# Patient Record
Sex: Female | Born: 1990 | Race: White | Hispanic: Yes | Marital: Married | Smoking: Current every day smoker
Health system: Southern US, Community
[De-identification: ages and names within clinical notes are randomized; demographics above are authoritative.]

## PROBLEM LIST (undated history)

## (undated) DIAGNOSIS — Z8619 Personal history of other infectious and parasitic diseases: Secondary | ICD-10-CM

## (undated) DIAGNOSIS — F329 Major depressive disorder, single episode, unspecified: Secondary | ICD-10-CM

## (undated) DIAGNOSIS — F32A Depression, unspecified: Secondary | ICD-10-CM

## (undated) HISTORY — DX: Major depressive disorder, single episode, unspecified: F32.9

## (undated) HISTORY — PX: HERNIA REPAIR: SHX51

## (undated) HISTORY — DX: Personal history of other infectious and parasitic diseases: Z86.19

## (undated) HISTORY — DX: Depression, unspecified: F32.A

---

## 2013-03-08 LAB — OB RESULTS CONSOLE ANTIBODY SCREEN: Antibody Screen: NEGATIVE

## 2013-03-08 LAB — OB RESULTS CONSOLE GC/CHLAMYDIA
Chlamydia: NEGATIVE
Gonorrhea: NEGATIVE

## 2013-03-08 LAB — OB RESULTS CONSOLE VARICELLA ZOSTER ANTIBODY, IGG: VARICELLA IGG: IMMUNE

## 2013-03-08 LAB — OB RESULTS CONSOLE HIV ANTIBODY (ROUTINE TESTING): HIV: NONREACTIVE

## 2013-03-08 LAB — OB RESULTS CONSOLE RUBELLA ANTIBODY, IGM: Rubella: IMMUNE

## 2013-03-08 LAB — OB RESULTS CONSOLE ABO/RH: RH Type: POSITIVE

## 2013-03-08 LAB — OB RESULTS CONSOLE RPR: RPR: NONREACTIVE

## 2013-03-08 LAB — OB RESULTS CONSOLE HEPATITIS B SURFACE ANTIGEN: HEP B S AG: NEGATIVE

## 2013-04-05 LAB — US OB COMP LESS 14 WKS

## 2013-04-26 LAB — OB RESULTS CONSOLE TSH: TSH: 2.65

## 2013-06-19 LAB — US OB COMP + 14 WK

## 2013-08-30 ENCOUNTER — Encounter: Payer: Self-pay | Admitting: *Deleted

## 2013-09-04 ENCOUNTER — Encounter: Payer: Self-pay | Admitting: Women's Health

## 2013-09-10 ENCOUNTER — Other Ambulatory Visit (HOSPITAL_COMMUNITY)
Admission: RE | Admit: 2013-09-10 | Discharge: 2013-09-10 | Disposition: A | Discharge status: Home / Self Care | Payer: Medicaid Other | Source: Ambulatory Visit | Attending: Obstetrics & Gynecology | Admitting: Obstetrics & Gynecology

## 2013-09-10 ENCOUNTER — Ambulatory Visit (INDEPENDENT_AMBULATORY_CARE_PROVIDER_SITE_OTHER): Payer: Medicaid Other | Admitting: Women's Health

## 2013-09-10 ENCOUNTER — Encounter: Payer: Self-pay | Admitting: Women's Health

## 2013-09-10 VITALS — BP 118/58 | Ht 63.0 in | Wt 192.5 lb

## 2013-09-10 DIAGNOSIS — F17200 Nicotine dependence, unspecified, uncomplicated: Secondary | ICD-10-CM

## 2013-09-10 DIAGNOSIS — O418X10 Other specified disorders of amniotic fluid and membranes, first trimester, not applicable or unspecified: Secondary | ICD-10-CM

## 2013-09-10 DIAGNOSIS — O34219 Maternal care for unspecified type scar from previous cesarean delivery: Secondary | ICD-10-CM

## 2013-09-10 DIAGNOSIS — A5901 Trichomonal vulvovaginitis: Secondary | ICD-10-CM

## 2013-09-10 DIAGNOSIS — Z3480 Encounter for supervision of other normal pregnancy, unspecified trimester: Secondary | ICD-10-CM

## 2013-09-10 DIAGNOSIS — Z01419 Encounter for gynecological examination (general) (routine) without abnormal findings: Secondary | ICD-10-CM | POA: Insufficient documentation

## 2013-09-10 DIAGNOSIS — Z331 Pregnant state, incidental: Secondary | ICD-10-CM

## 2013-09-10 DIAGNOSIS — O23591 Infection of other part of genital tract in pregnancy, first trimester: Secondary | ICD-10-CM | POA: Insufficient documentation

## 2013-09-10 DIAGNOSIS — O2390 Unspecified genitourinary tract infection in pregnancy, unspecified trimester: Secondary | ICD-10-CM

## 2013-09-10 DIAGNOSIS — F1290 Cannabis use, unspecified, uncomplicated: Secondary | ICD-10-CM

## 2013-09-10 DIAGNOSIS — Z98891 History of uterine scar from previous surgery: Secondary | ICD-10-CM

## 2013-09-10 DIAGNOSIS — O98519 Other viral diseases complicating pregnancy, unspecified trimester: Secondary | ICD-10-CM

## 2013-09-10 DIAGNOSIS — Z1389 Encounter for screening for other disorder: Secondary | ICD-10-CM

## 2013-09-10 LAB — POCT URINALYSIS DIPSTICK
Blood, UA: NEGATIVE
Glucose, UA: NEGATIVE
Ketones, UA: NEGATIVE
Leukocytes, UA: NEGATIVE
NITRITE UA: NEGATIVE
Protein, UA: NEGATIVE

## 2013-09-10 NOTE — Patient Instructions (Addendum)
FMLA (Family Medical Leave Act)  Third Trimester of Pregnancy The third trimester is from week 29 through week 42, months 7 through 9. The third trimester is a time when the fetus is growing rapidly. At the end of the ninth month, the fetus is about 20 inches in length and weighs 6 10 pounds.  BODY CHANGES Your body goes through many changes during pregnancy. The changes vary from woman to woman.   Your weight will continue to increase. You can expect to gain 25 35 pounds (11 16 kg) by the end of the pregnancy.  You may begin to get stretch marks on your hips, abdomen, and breasts.  You may urinate more often because the fetus is moving lower into your pelvis and pressing on your bladder.  You may develop or continue to have heartburn as a result of your pregnancy.  You may develop constipation because certain hormones are causing the muscles that push waste through your intestines to slow down.  You may develop hemorrhoids or swollen, bulging veins (varicose veins).  You may have pelvic pain because of the weight gain and pregnancy hormones relaxing your joints between the bones in your pelvis. Back aches may result from over exertion of the muscles supporting your posture.  Your breasts will continue to grow and be tender. A yellow discharge may leak from your breasts called colostrum.  Your belly button may stick out.  You may feel short of breath because of your expanding uterus.  You may notice the fetus "dropping," or moving lower in your abdomen.  You may have a bloody mucus discharge. This usually occurs a few days to a week before labor begins.  Your cervix becomes thin and soft (effaced) near your due date. WHAT TO EXPECT AT YOUR PRENATAL EXAMS  You will have prenatal exams every 2 weeks until week 36. Then, you will have weekly prenatal exams. During a routine prenatal visit:  You will be weighed to make sure you and the fetus are growing normally.  Your blood pressure  is taken.  Your abdomen will be measured to track your baby's growth.  The fetal heartbeat will be listened to.  Any test results from the previous visit will be discussed.  You may have a cervical check near your due date to see if you have effaced. At around 36 weeks, your caregiver will check your cervix. At the same time, your caregiver will also perform a test on the secretions of the vaginal tissue. This test is to determine if a type of bacteria, Group B streptococcus, is present. Your caregiver will explain this further. Your caregiver may ask you:  What your birth plan is.  How you are feeling.  If you are feeling the baby move.  If you have had any abnormal symptoms, such as leaking fluid, bleeding, severe headaches, or abdominal cramping.  If you have any questions. Other tests or screenings that may be performed during your third trimester include:  Blood tests that check for low iron levels (anemia).  Fetal testing to check the health, activity level, and growth of the fetus. Testing is done if you have certain medical conditions or if there are problems during the pregnancy. FALSE LABOR You may feel small, irregular contractions that eventually go away. These are called Braxton Hicks contractions, or false labor. Contractions may last for hours, days, or even weeks before true labor sets in. If contractions come at regular intervals, intensify, or become painful, it is best to  be seen by your caregiver.  SIGNS OF LABOR   Menstrual-like cramps.  Contractions that are 5 minutes apart or less.  Contractions that start on the top of the uterus and spread down to the lower abdomen and back.  A sense of increased pelvic pressure or back pain.  A watery or bloody mucus discharge that comes from the vagina. If you have any of these signs before the 37th week of pregnancy, call your caregiver right away. You need to go to the hospital to get checked immediately. HOME CARE  INSTRUCTIONS   Avoid all smoking, herbs, alcohol, and unprescribed drugs. These chemicals affect the formation and growth of the baby.  Follow your caregiver's instructions regarding medicine use. There are medicines that are either safe or unsafe to take during pregnancy.  Exercise only as directed by your caregiver. Experiencing uterine cramps is a good sign to stop exercising.  Continue to eat regular, healthy meals.  Wear a good support bra for breast tenderness.  Do not use hot tubs, steam rooms, or saunas.  Wear your seat belt at all times when driving.  Avoid raw meat, uncooked cheese, cat litter boxes, and soil used by cats. These carry germs that can cause birth defects in the baby.  Take your prenatal vitamins.  Try taking a stool softener (if your caregiver approves) if you develop constipation. Eat more high-fiber foods, such as fresh vegetables or fruit and whole grains. Drink plenty of fluids to keep your urine clear or pale yellow.  Take warm sitz baths to soothe any pain or discomfort caused by hemorrhoids. Use hemorrhoid cream if your caregiver approves.  If you develop varicose veins, wear support hose. Elevate your feet for 15 minutes, 3 4 times a day. Limit salt in your diet.  Avoid heavy lifting, wear low heal shoes, and practice good posture.  Rest a lot with your legs elevated if you have leg cramps or low back pain.  Visit your dentist if you have not gone during your pregnancy. Use a soft toothbrush to brush your teeth and be gentle when you floss.  A sexual relationship may be continued unless your caregiver directs you otherwise.  Do not travel far distances unless it is absolutely necessary and only with the approval of your caregiver.  Take prenatal classes to understand, practice, and ask questions about the labor and delivery.  Make a trial run to the hospital.  Pack your hospital bag.  Prepare the baby's nursery.  Continue to go to all your  prenatal visits as directed by your caregiver. SEEK MEDICAL CARE IF:  You are unsure if you are in labor or if your water has broken.  You have dizziness.  You have mild pelvic cramps, pelvic pressure, or nagging pain in your abdominal area.  You have persistent nausea, vomiting, or diarrhea.  You have a bad smelling vaginal discharge.  You have pain with urination. SEEK IMMEDIATE MEDICAL CARE IF:   You have a fever.  You are leaking fluid from your vagina.  You have spotting or bleeding from your vagina.  You have severe abdominal cramping or pain.  You have rapid weight loss or gain.  You have shortness of breath with chest pain.  You notice sudden or extreme swelling of your face, hands, ankles, feet, or legs.  You have not felt your baby move in over an hour.  You have severe headaches that do not go away with medicine.  You have vision changes. Document Released:  07/06/2001 Document Revised: 03/14/2013 Document Reviewed: 09/12/2012 [facility] Patient Information 2014 [place].

## 2013-09-10 NOTE — Progress Notes (Signed)
Subjective:  Cathy Larson is a 23 y.o. G53P2002 Caucasian female at [redacted]w[redacted]d by LMP which differs from 10wk u/s by 6d, being seen today for her first obstetrical visit w/ [place]. She is a transfer from [facility] in [place], states she didn't feel comfortable w/ that practice.  Her obstetrical history is significant for smoker and prev c/s x 2- 1st for FTP @ 5cm, 2nd was RLTCS.  Interested in VBAC. Pregnancy history fully reviewed.  She desires CF screening.  Patient reports darkened area bilateral upper inner thighs. Denies vb, cramping, uti s/s, abnormal/malodorous vag d/c, or vulvovaginal itching/irritation.    She had an initially low TSH, repeat was normal. She also had +trichomonas and was treated. No poc noted in records. States she hasn't had a pap smear since last preg 47yrs ago. Her early uds was +for THC. NT u/s report states possible amniotic band- no limbs involved. No mention of this on anatomy u/s.    BP 118/58  Ht 5\' 3"  (1.6 m)  Wt 192 lb 8 oz (87.317 kg)  BMI 34.11 kg/m2  LMP 01/25/2013 47lb weight gain to date during pregnancy  HISTORY: OB History  Gravida Para Term Preterm AB SAB TAB Ectopic Multiple Living  3 2 2       2     # Outcome Date GA Lbr Len/2nd Weight Sex Delivery Anes PTL Lv  3 CUR           2 TRM 06/05/09 [redacted]w[redacted]d  7 lb 8 oz (3.402 kg) F LTCS Spinal  Y  1 TRM 06/21/07 [redacted]w[redacted]d  8 lb 9 oz (3.884 kg) M LTCS EPI  Y     Comments: FTP @ 5cm     Past Medical History  Diagnosis Date  . Depression   . History of trichomoniasis    Past Surgical History  Procedure Laterality Date  . Cesarean section    . Hernia repair      pt was 60 weeks old   Family History  Problem Relation Age of Onset  . Thyroid disease Mother   . Cancer Maternal Grandmother     brain  . Cancer Other     lung  . Cancer Other     brain    Exam   System:     General: Well developed & nourished, no acute distress   Skin: Warm & dry, normal coloration, turgor, no rashes   Neurologic: Alert & oriented, normal mood   Cardiovascular: Regular rate & rhythm   Respiratory: Effort & rate normal, LCTAB, acyanotic   Abdomen: Soft, non tender   Extremities: normal strength, tone   Pelvic Exam:    Perineum: Normal perineum   Vulva: Normal, no lesions- she does have a darkened discoloration to bilateral upper inner thighs towards vulva   Vagina:  Normal mucosa, normal discharge   Cervix: Normal, bulbous, appears closed   Uterus: Normal size/shape/contour for GA   Thin prep pap smear obtained  FHR: 148 via doppler   Assessment:   Pregnancy: [id number] Patient Active Problem List   Diagnosis Date Noted  . Supervision of other normal pregnancy 09/10/2013    Priority: High  . Previous cesarean section x 2 09/10/2013    Priority: High  . Marijuana use 09/10/2013    Priority: High  . Smoker 09/10/2013    Priority: High  . Trichomonal vaginitis in pregnancy in first trimester 09/10/2013    Priority: High  . 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 09/10/2013  Priority: High    [id number] G3P2002 New OB visit Smoker Prev c/s x 2 Early uds +THC Trich 1st trimester- treated 47lb weight gain to date during pregnancy  Plan:  Labs: CF, HSV2, UDS, thin prep pap today Continue prenatal vitamins Problem list reviewed and updated Reviewed warning s/s to report Discussed r/b of VBAC after 2 c/s, wouldn't be able to induce/augment- she decided for RLTCS Reviewed recommended weight gain based on pre-gravid BMI, discussed current weight gain Encouraged well-balanced diet Genetic Screening discussed Integrated Screen: results reviewed Cystic fibrosis screening discussed requested Ultrasound discussed; fetal survey: results reviewed Follow up in 2 weeks for visit  Marge DuncansBooker, Antuane Eastridge Randall CNM, [facility]NP[facility] 09/10/2013 4:08 PM

## 2013-09-11 LAB — DRUG SCREEN, URINE, NO CONFIRMATION
Amphetamine Screen, Ur: NEGATIVE
BENZODIAZEPINES.: NEGATIVE
Barbiturate Quant, Ur: NEGATIVE
Cocaine Metabolites: NEGATIVE
Creatinine,U: 70.2 mg/dL
Marijuana Metabolite: NEGATIVE
Methadone: NEGATIVE
Opiate Screen, Urine: NEGATIVE
PROPOXYPHENE: NEGATIVE
Phencyclidine (PCP): NEGATIVE

## 2013-09-11 LAB — HSV 2 ANTIBODY, IGG: HSV 2 GLYCOPROTEIN G AB, IGG: 3.26 IV — AB

## 2013-09-11 LAB — CYSTIC FIBROSIS DIAGNOSTIC STUDY

## 2013-09-12 ENCOUNTER — Encounter: Payer: Self-pay | Admitting: Women's Health

## 2013-09-12 DIAGNOSIS — R768 Other specified abnormal immunological findings in serum: Secondary | ICD-10-CM | POA: Insufficient documentation

## 2013-09-17 ENCOUNTER — Encounter: Payer: Self-pay | Admitting: Women's Health

## 2013-09-17 ENCOUNTER — Telehealth: Payer: Self-pay | Admitting: Women's Health

## 2013-09-17 DIAGNOSIS — R87619 Unspecified abnormal cytological findings in specimens from cervix uteri: Secondary | ICD-10-CM | POA: Insufficient documentation

## 2013-09-17 NOTE — Telephone Encounter (Signed)
Notified pt of abnormal pap and need for colpo at next visit. States she is currently at hospital w/ vb and uc's, just got u/s and is waiting to hear results. To keep appt on 3/2 for visit and colpo as long as she is not in hospital.   Cheral MarkerKimberly R. Booker, CNM, [facility]NP-BC 09/17/2013 1:59 PM

## 2013-09-24 ENCOUNTER — Encounter: Payer: Medicaid Other | Admitting: Obstetrics and Gynecology

## 2013-09-26 ENCOUNTER — Encounter: Payer: Medicaid Other | Admitting: Obstetrics & Gynecology

## 2013-10-01 ENCOUNTER — Encounter: Payer: Self-pay | Admitting: Obstetrics and Gynecology

## 2013-10-01 ENCOUNTER — Ambulatory Visit (INDEPENDENT_AMBULATORY_CARE_PROVIDER_SITE_OTHER): Payer: Medicaid Other | Admitting: Obstetrics and Gynecology

## 2013-10-01 VITALS — BP 130/70 | Ht 63.0 in | Wt 174.0 lb

## 2013-10-01 DIAGNOSIS — Z3202 Encounter for pregnancy test, result negative: Secondary | ICD-10-CM

## 2013-10-01 DIAGNOSIS — Z9889 Other specified postprocedural states: Secondary | ICD-10-CM

## 2013-10-01 LAB — POCT URINE PREGNANCY: PREG TEST UR: NEGATIVE

## 2013-10-01 NOTE — Patient Instructions (Signed)

## 2013-10-01 NOTE — Progress Notes (Signed)
  This chart was scribed by Leone PayorSonum Patel, Medical Scribe, for Dr. Christin BachJohn Orvan Papadakis on 10/01/13 at 10:16 AM. This chart was reviewed by Dr. Christin BachJohn Malakie Balis and is accurate.   Subjective:  Cathy Larson is a 23 y.o. female who presents for a 2 weeks postpartum visit  Patient concerns: would like depo provera for birth control  Prenatal and intrapartum course notable for emergency cesarean section at 34 weeks after her placenta ruptured. [facility]   Patient is not sexually active.   The following portions of the patient's history were reviewed and updated as appropriate: allergies, current medications, past family history, past medical history, past surgical history and problem list.  Review of Systems    See Subjective, otherwise negative ROS.  Objective:  BP 130/70  Ht 5\' 3"  (1.6 m)  Wt 174 lb (78.926 kg)  BMI 30.83 kg/m2  LMP 01/25/2013  General:  alert, cooperative and no distress     Lungs: clear to auscultation bilaterally  Heart:  regular rate and rhythm, S1, S2 normal, no murmur  Abdomen: soft, non-tender; bowel sounds normal; no masses,  no organomegaly, surgical site is well healed, no redness, edges approximated, no tenderness   Vulva: Not indicated  Vagina: Not indicated  Cervix:  Not indicated  Corpus: Not indicated  Adnexa:  Not indicated           Assessment:  1.  postpartum exam s/p repeat cesarean section  2. Contraception: depo provera    Plan:  1. Return in 2 weeks  2. Depo provera at return

## 2013-10-09 ENCOUNTER — Telehealth: Payer: Self-pay | Admitting: Women's Health

## 2013-10-09 NOTE — Telephone Encounter (Signed)
Pt states what can she do to help increase her breast milk. Pt encouraged to massage breast while feeding, feed more frequently, and can take Fenugreek. Call back as needed. Pt verbalized understanding.

## 2013-10-15 ENCOUNTER — Telehealth: Payer: Self-pay | Admitting: Obstetrics and Gynecology

## 2013-10-15 ENCOUNTER — Ambulatory Visit: Payer: Medicaid Other | Admitting: Women's Health

## 2013-10-15 NOTE — Telephone Encounter (Signed)
Pt asking can she take Kaopectate for diarrhea. Per Joellyn HaffKim Booker, CNM ok to take Imodium, push fluids, if no improvement call office back to be seen. Pt verbalized understanding. Pt has an appt this Wednesday, March 25,2015.

## 2013-10-17 ENCOUNTER — Encounter: Payer: Self-pay | Admitting: Women's Health

## 2013-10-17 ENCOUNTER — Encounter (INDEPENDENT_AMBULATORY_CARE_PROVIDER_SITE_OTHER): Payer: Self-pay

## 2013-10-17 ENCOUNTER — Ambulatory Visit (INDEPENDENT_AMBULATORY_CARE_PROVIDER_SITE_OTHER): Payer: Medicaid Other | Admitting: Women's Health

## 2013-10-17 VITALS — BP 120/60 | Ht 63.0 in | Wt 173.5 lb

## 2013-10-17 DIAGNOSIS — R768 Other specified abnormal immunological findings in serum: Secondary | ICD-10-CM

## 2013-10-17 DIAGNOSIS — Z3480 Encounter for supervision of other normal pregnancy, unspecified trimester: Secondary | ICD-10-CM

## 2013-10-17 DIAGNOSIS — Z3202 Encounter for pregnancy test, result negative: Secondary | ICD-10-CM

## 2013-10-17 DIAGNOSIS — Z98891 History of uterine scar from previous surgery: Secondary | ICD-10-CM | POA: Insufficient documentation

## 2013-10-17 LAB — POCT URINE PREGNANCY: PREG TEST UR: NEGATIVE

## 2013-10-17 MED ORDER — MEDROXYPROGESTERONE ACETATE 150 MG/ML IM SUSP: 150.0000 mg | INTRAMUSCULAR | Status: DC

## 2013-10-17 NOTE — Progress Notes (Addendum)
Patient ID: Cathy Larson, female   DOB: [birth year], 23 y.o.   MRN: [id number] Subjective:    Cathy Larson is a 23 y.o. 613P2002 Caucasian female who presents for a postpartum visit. She is 4 weeks postpartum following an emergent repeat cesarean section, low transverse incision at 34 gestational weeks d/t placental abruption.  She was at work and began bleeding, so [facility]. FHR was normal pt states, they told her it was just too much bleeding, so did emergent RLTCS. Anesthesia: spinal. I have fully reviewed the prenatal and intrapartum course. Postpartum course has been . Baby's course has been complicated by 1wk NICU stay, doing well now. Baby is feeding by breast. Bleeding no bleeding. Bowel function is normal. Bladder function is normal. Patient is sexually active. Last sexual activity: 3/20. Contraception method is none and desires depo. Postpartum depression screening: negative. Score 5.  Last pap 09/10/13 and was LSIL/HPV.  The following portions of the patient's history were reviewed and updated as appropriate: allergies, current medications, past medical history, past surgical history and problem list.  Review of Systems Pertinent items are noted in HPI.   Filed Vitals:   10/17/13 1054  BP: 120/60  Height: 5\' 3"  (1.6 m)  Weight: 173 lb 8 oz (78.699 kg)    Objective:     General:  alert, cooperative and no distress   Breasts:  deferred, no complaints  Lungs: clear to auscultation bilaterally  Heart:  regular rate and rhythm  Abdomen: soft, nontender, incision well-healed   Vulva: normal  Vagina: normal vagina  Cervix:  closed  Corpus: Well-involuted  Adnexa:  Non-palpable  Rectal Exam: No hemorrhoids        Assessment:   Postpartum exam 4 wks s/p emergent RLTCS @ 34wks d/t [facility] Depression screening Contraception counseling  Breastfeeding  Plan:   Contraception: desires depo, no sex until begins depo Rx  depo w/ 3RF Follow up in: 4/6 for depo, 4wks for colpo  Note to return to work 4/7, 8hr shifts, and to be able to pump Sign release to get op note [facility]  Marge DuncansBooker, Patrici Minnis Randall CNM, [facility]NP-BC 10/17/2013 11:26 AM

## 2013-10-17 NOTE — Patient Instructions (Signed)
Medroxyprogesterone injection [Contraceptive]- Depo What is this medicine? MEDROXYPROGESTERONE (me DROX ee proe JES te rone) contraceptive injections prevent pregnancy. They provide effective birth control for 3 months. Depo-subQ Provera 104 is also used for treating pain related to endometriosis. This medicine may be used for other purposes; ask your health care provider or pharmacist if you have questions. COMMON BRAND NAME(S): Depo-Provera, Depo-subQ Provera 104 What should I tell my health care provider before I take this medicine? They need to know if you have any of these conditions: -frequently drink alcohol -asthma -blood vessel disease or a history of a blood clot in the lungs or legs -bone disease such as osteoporosis -breast cancer -diabetes -eating disorder (anorexia nervosa or bulimia) -high blood pressure -HIV infection or AIDS -kidney disease -liver disease -mental depression -migraine -seizures (convulsions) -stroke -tobacco smoker -vaginal bleeding -an unusual or allergic reaction to medroxyprogesterone, other hormones, medicines, foods, dyes, or preservatives -pregnant or trying to get pregnant -breast-feeding How should I use this medicine? Depo-Provera Contraceptive injection is given into a muscle. Depo-subQ Provera 104 injection is given under the skin. These injections are given by a health care professional. You must not be pregnant before getting an injection. The injection is usually given during the first 5 days after the start of a menstrual period or 6 weeks after delivery of a baby. Talk to your pediatrician regarding the use of this medicine in children. Special care may be needed. These injections have been used in female children who have started having menstrual periods. Overdosage: If you think you have taken too much of this medicine contact a poison control center or emergency room at once. NOTE: This medicine is only for you. Do not share this  medicine with others. What if I miss a dose? Try not to miss a dose. You must get an injection once every 3 months to maintain birth control. If you cannot keep an appointment, call and reschedule it. If you wait longer than 13 weeks between Depo-Provera contraceptive injections or longer than 14 weeks between Depo-subQ Provera 104 injections, you could get pregnant. Use another method for birth control if you miss your appointment. You may also need a pregnancy test before receiving another injection. What may interact with this medicine? Do not take this medicine with any of the following medications: -bosentan This medicine may also interact with the following medications: -aminoglutethimide -antibiotics or medicines for infections, especially rifampin, rifabutin, rifapentine, and griseofulvin -aprepitant -barbiturate medicines such as phenobarbital or primidone -bexarotene -carbamazepine -medicines for seizures like ethotoin, felbamate, oxcarbazepine, phenytoin, topiramate -modafinil -St. John's wort This list may not describe all possible interactions. Give your health care provider a list of all the medicines, herbs, non-prescription drugs, or dietary supplements you use. Also tell them if you smoke, drink alcohol, or use illegal drugs. Some items may interact with your medicine. What should I watch for while using this medicine? This drug does not protect you against HIV infection (AIDS) or other sexually transmitted diseases. Use of this product may cause you to lose calcium from your bones. Loss of calcium may cause weak bones (osteoporosis). Only use this product for more than 2 years if other forms of birth control are not right for you. The longer you use this product for birth control the more likely you will be at risk for weak bones. Ask your health care professional how you can keep strong bones. You may have a change in bleeding pattern or irregular periods. Many females stop    having periods while taking this drug. If you have received your injections on time, your chance of being pregnant is very low. If you think you may be pregnant, see your health care professional as soon as possible. Tell your health care professional if you want to get pregnant within the next year. The effect of this medicine may last a long time after you get your last injection. What side effects may I notice from receiving this medicine? Side effects that you should report to your doctor or health care professional as soon as possible: -allergic reactions like skin rash, itching or hives, swelling of the face, lips, or tongue -breast tenderness or discharge -breathing problems -changes in vision -depression -feeling faint or lightheaded, falls -fever -pain in the abdomen, chest, groin, or leg -problems with balance, talking, walking -unusually weak or tired -yellowing of the eyes or skin Side effects that usually do not require medical attention (report to your doctor or health care professional if they continue or are bothersome): -acne -fluid retention and swelling -headache -irregular periods, spotting, or absent periods -temporary pain, itching, or skin reaction at site where injected -weight gain This list may not describe all possible side effects. Call your doctor for medical advice about side effects. You may report side effects to FDA at 1-800-FDA-1088. Where should I keep my medicine? This does not apply. The injection will be given to you by a health care professional. NOTE: This sheet is a summary. It may not cover all possible information. If you have questions about this medicine, talk to your doctor, pharmacist, or health care provider.  2014, Elsevier/Gold Standard. (2008-08-02 18:37:56)  

## 2013-10-29 ENCOUNTER — Encounter: Payer: Self-pay | Admitting: Adult Health

## 2013-10-29 ENCOUNTER — Ambulatory Visit (INDEPENDENT_AMBULATORY_CARE_PROVIDER_SITE_OTHER): Payer: Medicaid Other | Admitting: Adult Health

## 2013-10-29 VITALS — BP 108/46 | Ht 63.0 in | Wt 179.0 lb

## 2013-10-29 DIAGNOSIS — Z3202 Encounter for pregnancy test, result negative: Secondary | ICD-10-CM

## 2013-10-29 DIAGNOSIS — Z3049 Encounter for surveillance of other contraceptives: Secondary | ICD-10-CM

## 2013-10-29 DIAGNOSIS — Z309 Encounter for contraceptive management, unspecified: Secondary | ICD-10-CM

## 2013-10-29 LAB — POCT URINE PREGNANCY: PREG TEST UR: NEGATIVE

## 2013-10-29 MED ORDER — MEDROXYPROGESTERONE ACETATE 150 MG/ML IM SUSP
150.0000 mg | Freq: Once | INTRAMUSCULAR | Status: AC
  Administered 2013-10-29: 150 mg via INTRAMUSCULAR

## 2013-10-29 NOTE — Progress Notes (Signed)
Patient ID: Cathy Larson, female   DOB: Jul 09, 1991, 23 y.o.   MRN: [id number] Depo provera 150 mg IM given right deltoid with no complications, resulted negative pregnancy test. Pt to return in 12 weeks for next injection.

## 2013-11-14 ENCOUNTER — Ambulatory Visit (INDEPENDENT_AMBULATORY_CARE_PROVIDER_SITE_OTHER): Payer: Medicaid Other | Admitting: Obstetrics & Gynecology

## 2013-11-14 ENCOUNTER — Other Ambulatory Visit: Payer: Medicaid Other | Admitting: Women's Health

## 2013-11-14 ENCOUNTER — Encounter: Payer: Self-pay | Admitting: Obstetrics & Gynecology

## 2013-11-14 VITALS — BP 100/60 | Wt 176.0 lb

## 2013-11-14 DIAGNOSIS — N870 Mild cervical dysplasia: Secondary | ICD-10-CM

## 2013-11-14 MED ORDER — SULFAMETHOXAZOLE-TMP DS 800-160 MG PO TABS: 1.0000 | ORAL_TABLET | Freq: Two times a day (BID) | ORAL | Status: DC

## 2013-11-14 NOTE — Progress Notes (Signed)
Patient ID: Cathy Larson, female   DOB: 07-04-1991, 23 y.o.   MRN: [id number] Pap LSIL, history of previous Pap at age 23 Colposcopy Adequate No punctation No mosaicism No abnormal vessels  Past Medical History  Diagnosis Date  . Depression   . History of trichomoniasis     Past Surgical History  Procedure Laterality Date  . Cesarean section    . Hernia repair      pt was 96 weeks old    OB History   Grav Para Term Preterm Abortions TAB SAB Ect Mult Living   3 3 2 1      3       No Known Allergies  History   Social History  . Marital Status: Married    Spouse Name: N/A    Number of Children: N/A  . Years of Education: N/A   Social History Main Topics  . Smoking status: Current Every Day Smoker -- 0.40 packs/day    Types: Cigarettes  . Smokeless tobacco: Never Used  . Alcohol Use: No  . Drug Use: No     Comment: not now  . Sexual Activity: Yes    Birth Control/ Protection: None   Other Topics Concern  . None   Social History Narrative  . None    Family History  Problem Relation Age of Onset  . Thyroid disease Mother   . Cancer Maternal Grandmother     brain  . Cancer Other     lung  . Cancer Other     brain

## 2013-11-27 ENCOUNTER — Ambulatory Visit: Payer: Medicaid Other | Admitting: Obstetrics & Gynecology

## 2013-11-28 ENCOUNTER — Ambulatory Visit: Payer: Medicaid Other | Admitting: Obstetrics & Gynecology

## 2013-12-03 ENCOUNTER — Emergency Department (HOSPITAL_COMMUNITY)
Admission: EM | Admit: 2013-12-03 | Discharge: 2013-12-03 | Disposition: A | Discharge status: Home / Self Care | Payer: Medicaid Other | Attending: Emergency Medicine | Admitting: Emergency Medicine

## 2013-12-03 ENCOUNTER — Encounter (HOSPITAL_COMMUNITY): Payer: Self-pay | Admitting: Emergency Medicine

## 2013-12-03 DIAGNOSIS — L0390 Cellulitis, unspecified: Secondary | ICD-10-CM

## 2013-12-03 DIAGNOSIS — Z8659 Personal history of other mental and behavioral disorders: Secondary | ICD-10-CM | POA: Insufficient documentation

## 2013-12-03 DIAGNOSIS — L03319 Cellulitis of trunk, unspecified: Principal | ICD-10-CM

## 2013-12-03 DIAGNOSIS — Z8619 Personal history of other infectious and parasitic diseases: Secondary | ICD-10-CM | POA: Insufficient documentation

## 2013-12-03 DIAGNOSIS — L02219 Cutaneous abscess of trunk, unspecified: Secondary | ICD-10-CM | POA: Insufficient documentation

## 2013-12-03 DIAGNOSIS — F17200 Nicotine dependence, unspecified, uncomplicated: Secondary | ICD-10-CM | POA: Insufficient documentation

## 2013-12-03 MED ORDER — SULFAMETHOXAZOLE-TMP DS 800-160 MG PO TABS
1.0000 | ORAL_TABLET | Freq: Once | ORAL | Status: AC
  Administered 2013-12-03: 1 via ORAL
  Filled 2013-12-03: qty 1

## 2013-12-03 MED ORDER — SULFAMETHOXAZOLE-TMP DS 800-160 MG PO TABS: 1.0000 | ORAL_TABLET | Freq: Two times a day (BID) | ORAL | Status: DC

## 2013-12-03 NOTE — Discharge Instructions (Signed)
Cellulitis Cellulitis is an infection of the skin and the tissue under the skin. The infected area is usually red and tender. This happens most often in the arms and lower legs. HOME CARE   Take your antibiotic medicine as told. Finish the medicine even if you start to feel better.  Keep the infected arm or leg raised (elevated).  Put a warm cloth on the area up to 4 times per day.  Only take medicines as told by your doctor.  Keep all doctor visits as told. GET HELP RIGHT AWAY IF:   You have a fever.  You feel very sleepy.  You throw up (vomit) or have watery poop (diarrhea).  You feel sick and have muscle aches and pains.  You see red streaks on the skin coming from the infected area.  Your red area gets bigger or turns a dark color.  Your bone or joint under the infected area is painful after the skin heals.  Your infection comes back in the same area or different area.  You have a puffy (swollen) bump in the infected area.  You have new symptoms. MAKE SURE YOU:   Understand these instructions.  Will watch your condition.  Will get help right away if you are not doing well or get worse. Document Released: 12/29/2007 Document Revised: 01/11/2012 Document Reviewed: 09/27/2011 [facility] Patient Information 2014 [employer], [place].   Soak in hot soapy tub.   Simple gauze dressing.  Antibiotic twice a day. Follow up with Dr. Emelda FearFerguson.

## 2013-12-03 NOTE — ED Provider Notes (Signed)
CSN: [id number]     Arrival date & time 12/03/13  2101 History  This chart was scribed for Donnetta HutchingBrian Kwan Shellhammer, MD by Quintella ReichertMatthew Underwood, ED scribe.  This patient was seen in room APA15/APA15 and the patient's care was started at 10:32 PM.   Chief Complaint  Patient presents with  . Wound Dehiscence    c-section scar has small open area with drainage    The history is provided by the patient. No language interpreter was used.    HPI Comments: Cathy Larson is a 23 y.o. female who presents to the Emergency Department complaining of wound dehiscence at a C-section site.  Pt had a C-section on February 23rd.  She states that initially the wound appeared to be healing well and she went back to work.  However for the past 15 days she has noted a small open area on the right side of the scar, with a small amount of discharge.  She states that the area is painful and when she wears pants it becomes red and swollen.  Pain is worsened by moving her bowels, urinating, and coughing.  She went to her OB/GYN about her symptoms and was placed on antibiotics which she finished completely, but her Medicaid ran out so she could not afford to follow up after this.    OB/GYN is Dr. Emelda FearFerguson   Past Medical History  Diagnosis Date  . Depression   . History of trichomoniasis     Past Surgical History  Procedure Laterality Date  . Cesarean section    . Hernia repair      pt was 336 weeks old    Family History  Problem Relation Age of Onset  . Thyroid disease Mother   . Cancer Maternal Grandmother     brain  . Cancer Other     lung  . Cancer Other     brain    History  Substance Use Topics  . Smoking status: Current Every Day Smoker -- 0.40 packs/day    Types: Cigarettes  . Smokeless tobacco: Never Used  . Alcohol Use: No    OB History   Grav Para Term Preterm Abortions TAB SAB Ect Mult Living   3 3 2 1      3        Review of Systems A complete 10 system review of systems was obtained and all  systems are negative except as noted in the HPI and PMH.     Allergies  Review of patient's allergies indicates no known allergies.  Home Medications   Prior to Admission medications   Medication Sig Start Date End Date Taking? Authorizing Provider  medroxyPROGESTERone (DEPO-PROVERA) 150 MG/ML injection Inject 1 mL (150 mg total) into the muscle every 3 (three) months. 10/17/13  Yes Marge DuncansKimberly Randall Booker, CNM   BP 107/50  Pulse 79  Temp(Src) 98.9 F (37.2 C) (Oral)  Resp 20  Ht 5\' 3"  (1.6 m)  Wt 175 lb (79.379 kg)  BMI 31.01 kg/m2  SpO2 99%  LMP 10/21/2013  Breastfeeding? No  Physical Exam  Nursing note and vitals reviewed. Constitutional: She is oriented to person, place, and time. She appears well-developed and well-nourished.  HENT:  Head: Normocephalic and atraumatic.  Eyes: Conjunctivae and EOM are normal. Pupils are equal, round, and reactive to light.  Neck: Normal range of motion. Neck supple.  Cardiovascular: Normal rate, regular rhythm and normal heart sounds.   Pulmonary/Chest: Effort normal and breath sounds normal.  Abdominal: Soft. Bowel sounds  are normal.  12-cm horizontal scar on suprapubic area.  On right side of scar there is an area of erythema about 2 cm in length, with a central 2-mm opening.  Musculoskeletal: Normal range of motion.  Neurological: She is alert and oriented to person, place, and time.  Skin: Skin is warm and dry.  Psychiatric: She has a normal mood and affect. Her behavior is normal.    ED Course  Procedures (including critical care time)  DIAGNOSTIC STUDIES: Oxygen Saturation is 99% on room air, normal by my interpretation.    COORDINATION OF CARE: 10:38 PM-Discussed treatment plan which includes antibiotics with pt at bedside and pt agreed to plan.     Labs Review Labs Reviewed - No data to display  Imaging Review No results found.   EKG Interpretation None      MDM   Final diagnoses:  Cellulitis    Wound  has minor evidence of cellulitis. I do not suspect a fistula tract.  Rx Septra DS twice a day. Followup with her obstetrician.    I personally performed the services described in this documentation, which was scribed in my presence. The recorded information has been reviewed and is accurate.    Donnetta HutchingBrian Sakari Alkhatib, MD 12/03/13 [phone]

## 2013-12-03 NOTE — ED Notes (Signed)
Pt had c- section 2/23 ,  Says incision has been infected for 15 days.  Finished antibiotic. Pt says pus is draining from incision.

## 2014-01-21 ENCOUNTER — Ambulatory Visit: Payer: Medicaid Other

## 2014-01-22 ENCOUNTER — Ambulatory Visit: Payer: Medicaid Other

## 2014-05-27 ENCOUNTER — Encounter (HOSPITAL_COMMUNITY): Payer: Self-pay | Admitting: Emergency Medicine

## 2015-02-21 ENCOUNTER — Encounter (HOSPITAL_COMMUNITY): Payer: Self-pay | Admitting: Emergency Medicine

## 2015-02-21 ENCOUNTER — Emergency Department (HOSPITAL_COMMUNITY)
Admission: EM | Admit: 2015-02-21 | Discharge: 2015-02-22 | Disposition: A | Discharge status: Home / Self Care | Payer: Medicaid Other | Attending: Emergency Medicine | Admitting: Emergency Medicine

## 2015-02-21 ENCOUNTER — Emergency Department (HOSPITAL_COMMUNITY): Payer: Medicaid Other

## 2015-02-21 DIAGNOSIS — Z8659 Personal history of other mental and behavioral disorders: Secondary | ICD-10-CM | POA: Insufficient documentation

## 2015-02-21 DIAGNOSIS — R195 Other fecal abnormalities: Secondary | ICD-10-CM | POA: Insufficient documentation

## 2015-02-21 DIAGNOSIS — R110 Nausea: Secondary | ICD-10-CM | POA: Insufficient documentation

## 2015-02-21 DIAGNOSIS — K625 Hemorrhage of anus and rectum: Secondary | ICD-10-CM | POA: Insufficient documentation

## 2015-02-21 DIAGNOSIS — R42 Dizziness and giddiness: Secondary | ICD-10-CM | POA: Insufficient documentation

## 2015-02-21 DIAGNOSIS — Z720 Tobacco use: Secondary | ICD-10-CM | POA: Insufficient documentation

## 2015-02-21 DIAGNOSIS — R1010 Upper abdominal pain, unspecified: Secondary | ICD-10-CM | POA: Insufficient documentation

## 2015-02-21 DIAGNOSIS — M545 Low back pain: Secondary | ICD-10-CM | POA: Insufficient documentation

## 2015-02-21 DIAGNOSIS — Z8619 Personal history of other infectious and parasitic diseases: Secondary | ICD-10-CM | POA: Insufficient documentation

## 2015-02-21 LAB — BASIC METABOLIC PANEL
ANION GAP: 8 (ref 5–15)
BUN: 14 mg/dL (ref 6–20)
CALCIUM: 9.2 mg/dL (ref 8.9–10.3)
CHLORIDE: 108 mmol/L (ref 101–111)
CO2: 24 mmol/L (ref 22–32)
Creatinine, Ser: 0.53 mg/dL (ref 0.44–1.00)
GLUCOSE: 114 mg/dL — AB (ref 65–99)
Potassium: 3.5 mmol/L (ref 3.5–5.1)
Sodium: 140 mmol/L (ref 135–145)

## 2015-02-21 LAB — CBC WITH DIFFERENTIAL/PLATELET
Basophils Absolute: 0 10*3/uL (ref 0.0–0.1)
Basophils Relative: 0 % (ref 0–1)
EOS PCT: 1 % (ref 0–5)
Eosinophils Absolute: 0.1 10*3/uL (ref 0.0–0.7)
HEMATOCRIT: 35 % — AB (ref 36.0–46.0)
Hemoglobin: 11.8 g/dL — ABNORMAL LOW (ref 12.0–15.0)
Lymphocytes Relative: 37 % (ref 12–46)
Lymphs Abs: 2.9 10*3/uL (ref 0.7–4.0)
MCH: 29.4 pg (ref 26.0–34.0)
MCHC: 33.7 g/dL (ref 30.0–36.0)
MCV: 87.1 fL (ref 78.0–100.0)
Monocytes Absolute: 0.6 10*3/uL (ref 0.1–1.0)
Monocytes Relative: 7 % (ref 3–12)
NEUTROS ABS: 4.4 10*3/uL (ref 1.7–7.7)
Neutrophils Relative %: 55 % (ref 43–77)
PLATELETS: 186 10*3/uL (ref 150–400)
RBC: 4.02 MIL/uL (ref 3.87–5.11)
RDW: 12.9 % (ref 11.5–15.5)
WBC: 8 10*3/uL (ref 4.0–10.5)

## 2015-02-21 LAB — URINALYSIS, ROUTINE W REFLEX MICROSCOPIC
Bilirubin Urine: NEGATIVE
GLUCOSE, UA: NEGATIVE mg/dL
Hgb urine dipstick: NEGATIVE
Ketones, ur: NEGATIVE mg/dL
Leukocytes, UA: NEGATIVE
NITRITE: NEGATIVE
PH: 5.5 (ref 5.0–8.0)
Protein, ur: NEGATIVE mg/dL
Urobilinogen, UA: 0.2 mg/dL (ref 0.0–1.0)

## 2015-02-21 LAB — LIPASE, BLOOD: Lipase: 17 U/L — ABNORMAL LOW (ref 22–51)

## 2015-02-21 LAB — PREGNANCY, URINE: Preg Test, Ur: NEGATIVE

## 2015-02-21 MED ORDER — FENTANYL CITRATE (PF) 100 MCG/2ML IJ SOLN
50.0000 ug | Freq: Once | INTRAMUSCULAR | Status: AC
  Administered 2015-02-21: 50 ug via INTRAVENOUS
  Filled 2015-02-21: qty 2

## 2015-02-21 MED ORDER — SODIUM CHLORIDE 0.9 % IV BOLUS (SEPSIS)
1000.0000 mL | Freq: Once | INTRAVENOUS | Status: AC
  Administered 2015-02-21: 1000 mL via INTRAVENOUS

## 2015-02-21 MED ORDER — IOHEXOL 300 MG/ML  SOLN
25.0000 mL | Freq: Once | INTRAMUSCULAR | Status: AC | PRN
  Administered 2015-02-21: 25 mL via ORAL

## 2015-02-21 MED ORDER — IOHEXOL 300 MG/ML  SOLN
100.0000 mL | Freq: Once | INTRAMUSCULAR | Status: AC | PRN
  Administered 2015-02-21: 100 mL via INTRAVENOUS

## 2015-02-21 MED ORDER — ONDANSETRON HCL 4 MG/2ML IJ SOLN
4.0000 mg | Freq: Once | INTRAMUSCULAR | Status: AC
  Administered 2015-02-21: 4 mg via INTRAVENOUS
  Filled 2015-02-21: qty 2

## 2015-02-21 NOTE — ED Provider Notes (Signed)
CSN: [id number]     Arrival date & time 02/21/15  2110 History  This chart was scribed for Devoria Albe, MD by Tanda Rockers, ED Scribe. This patient was seen in room APA17/APA17 and the patient's care was started at 11:08 PM.   Chief Complaint  Patient presents with  . Abdominal Pain  . Rectal Bleeding   The history is provided by the patient. No language interpreter was used.     HPI Comments: Cathy Larson is a 24 y.o. female who presents to the Emergency Department complaining of sudden onset, intermittent, epigastric abdominal pain x 4 days. Pt describes the pain as contractions and states that it usually lasts a few seconds. There are no modifying factors to the pain. She denies eating anything unusual that could have caused the pain. Pt also complains of constant lower back pain, nausea, and loose black stools that began 4 days ago as well. She notes that she has been having a bowel movement every hour for the past 4 days with no change in the black stools. Pt has never had abdominal pain like this in the past. Pt states she is eating normally despite the abdominal pain. Pt also states that she feels lightheaded with coughing or stretching. Denies vomiting, fever, dysuria, urinary frequency, or any other associated symptoms. Denies taking Pepto bismal or any OTC medication. Pt is not currently on birth control. She smokes 0.5 ppd. Denies drinking EtOH in the past week. She is currently employed as a Teacher, English as a foreign language.   PCP Dr Neita Carp at [facility] in [place]  Past Medical History  Diagnosis Date  . Depression   . History of trichomoniasis    Past Surgical History  Procedure Laterality Date  . Cesarean section    . Hernia repair      pt was 20 weeks old   Family History  Problem Relation Age of Onset  . Thyroid disease Mother   . Cancer Maternal Grandmother     brain  . Cancer Other     lung  . Cancer Other     brain   History  Substance Use Topics  . Smoking status: Current Every Day  Smoker -- 0.40 packs/day    Types: Cigarettes  . Smokeless tobacco: Never Used  . Alcohol Use: Yes     Comment: occasionally   Employed Lives with spouse smokes 1/2 ppd  OB History    Gravida Para Term Preterm AB TAB SAB Ectopic Multiple Living   3 3 2 1      3      Review of Systems  Constitutional: Negative for fever.  Gastrointestinal: Positive for nausea and abdominal pain. Negative for vomiting, diarrhea and constipation.  Genitourinary: Negative for dysuria and frequency.  Musculoskeletal: Positive for back pain.  Neurological: Positive for light-headedness.  All other systems reviewed and are negative.  Allergies  Review of patient's allergies indicates no known allergies.  Home Medications   Prior to Admission medications   Medication Sig Start Date End Date Taking? Authorizing Provider  medroxyPROGESTERone (DEPO-PROVERA) 150 MG/ML injection Inject 1 mL (150 mg total) into the muscle every 3 (three) months. Patient not taking: Reported on 02/21/2015 10/17/13   Cheral Marker, CNM  sulfamethoxazole-trimethoprim (BACTRIM DS) 800-160 MG per tablet Take 1 tablet by mouth 2 (two) times daily. Patient not taking: Reported on 02/21/2015 12/03/13   Donnetta Hutching, MD   Triage Vitals: BP 121/64 mmHg  Pulse 77  Temp(Src) 98.7 F (37.1 C) (Oral)  Resp  18  Ht 5\' 2"  (1.575 m)  Wt 175 lb (79.379 kg)  BMI 32.00 kg/m2  SpO2 100%  LMP 01/22/2015   Vital signs normal    Physical Exam  Constitutional: She is oriented to person, place, and time. She appears well-developed and well-nourished.  Non-toxic appearance. She does not appear ill. No distress.  HENT:  Head: Normocephalic and atraumatic.  Right Ear: External ear normal.  Left Ear: External ear normal.  Nose: Nose normal. No mucosal edema or rhinorrhea.  Mouth/Throat: Oropharynx is clear and moist. Mucous membranes are dry. No dental abscesses or uvula swelling.  Tongue is dry  Eyes: Conjunctivae and EOM are normal.  Pupils are equal, round, and reactive to light.  Neck: Normal range of motion and full passive range of motion without pain. Neck supple.  Cardiovascular: Normal rate, regular rhythm and normal heart sounds.  Exam reveals no gallop and no friction rub.   No murmur heard. Pulmonary/Chest: Effort normal and breath sounds normal. No respiratory distress. She has no wheezes. She has no rhonchi. She has no rales. She exhibits no tenderness and no crepitus.  Abdominal: Soft. Normal appearance and bowel sounds are normal. She exhibits no distension. There is tenderness. There is no rebound and no guarding.    Diffuse upper abdominal pain that makes pt tearful to palpation and is worse in the LUQ  Musculoskeletal: Normal range of motion. She exhibits tenderness. She exhibits no edema.  Moves all extremities well.  Pain in the lower sacral area  Neurological: She is alert and oriented to person, place, and time. She has normal strength. No cranial nerve deficit.  Skin: Skin is warm, dry and intact. No rash noted. No erythema. No pallor.  Psychiatric: She has a normal mood and affect. Her speech is normal and behavior is normal. Her mood appears not anxious.  Nursing note and vitals reviewed.   ED Course  Procedures (including critical care time) Medications  sodium chloride 0.9 % bolus 1,000 mL (0 mLs Intravenous Stopped 02/22/15 0147)  ondansetron (ZOFRAN) injection 4 mg (4 mg Intravenous Given 02/21/15 2346)  fentaNYL (SUBLIMAZE) injection 50 mcg (50 mcg Intravenous Given 02/21/15 2346)  iohexol (OMNIPAQUE) 300 MG/ML solution 25 mL (25 mLs Oral Contrast Given 02/21/15 2358)  iohexol (OMNIPAQUE) 300 MG/ML solution 100 mL (100 mLs Intravenous Contrast Given 02/21/15 2358)  famotidine (PEPCID) IVPB 20 mg premix (0 mg Intravenous Stopped 02/22/15 0221)     DIAGNOSTIC STUDIES: Oxygen Saturation is 100% on RA, normal by my interpretation.    COORDINATION OF CARE: 11:18 PM-Discussed treatment plan  which includes CT A/P, Occult blood, Gi pathogen panel, Lipase with pt at bedside and pt agreed to plan. We discussed her laboratory results which have already resulted.  The nurse showed me the patient's stool. It was a small well formed and a dark green/brownstool. It was Hemoccult-negative. Patient only had that one episode of stool during her ED visit of 6 hours.  1:25 AM patient was given the results of her CT scan. I have added IV Pepcid. She appears more comfortable than she was. We discussed following up with her primary care doctor and possible referral to GI for endoscopy if she continues to have discomfort. We discussed dietary limitations for the next several days.  Labs Review Results for orders placed or performed during the hospital encounter of 02/21/15  CBC WITH DIFFERENTIAL  Result Value Ref Range   WBC 8.0 4.0 - 10.5 K/uL   RBC 4.02 3.87 - 5.11  MIL/uL   Hemoglobin 11.8 (L) 12.0 - 15.0 g/dL   HCT 95.6 (L) 21.3 - 08.6 %   MCV 87.1 78.0 - 100.0 fL   MCH 29.4 26.0 - 34.0 pg   MCHC 33.7 30.0 - 36.0 g/dL   RDW 57.8 46.9 - 62.9 %   Platelets 186 150 - 400 K/uL   Neutrophils Relative % 55 43 - 77 %   Neutro Abs 4.4 1.7 - 7.7 K/uL   Lymphocytes Relative 37 12 - 46 %   Lymphs Abs 2.9 0.7 - 4.0 K/uL   Monocytes Relative 7 3 - 12 %   Monocytes Absolute 0.6 0.1 - 1.0 K/uL   Eosinophils Relative 1 0 - 5 %   Eosinophils Absolute 0.1 0.0 - 0.7 K/uL   Basophils Relative 0 0 - 1 %   Basophils Absolute 0.0 0.0 - 0.1 K/uL  Basic metabolic panel  Result Value Ref Range   Sodium 140 135 - 145 mmol/L   Potassium 3.5 3.5 - 5.1 mmol/L   Chloride 108 101 - 111 mmol/L   CO2 24 22 - 32 mmol/L   Glucose, Bld 114 (H) 65 - 99 mg/dL   BUN 14 6 - 20 mg/dL   Creatinine, Ser 5.28 0.44 - 1.00 mg/dL   Calcium 9.2 8.9 - 41.3 mg/dL   GFR calc non Af Amer >60 >60 mL/min   GFR calc Af Amer >60 >60 mL/min   Anion gap 8 5 - 15  Pregnancy, urine  Result Value Ref Range   Preg Test, Ur NEGATIVE  NEGATIVE  Urinalysis, Routine w reflex microscopic (not at [facility])  Result Value Ref Range   Color, Urine YELLOW YELLOW   APPearance CLEAR CLEAR   Specific Gravity, Urine >1.030 (H) 1.005 - 1.030   pH 5.5 5.0 - 8.0   Glucose, UA NEGATIVE NEGATIVE mg/dL   Hgb urine dipstick NEGATIVE NEGATIVE   Bilirubin Urine NEGATIVE NEGATIVE   Ketones, ur NEGATIVE NEGATIVE mg/dL   Protein, ur NEGATIVE NEGATIVE mg/dL   Urobilinogen, UA 0.2 0.0 - 1.0 mg/dL   Nitrite NEGATIVE NEGATIVE   Leukocytes, UA NEGATIVE NEGATIVE  Lipase, blood  Result Value Ref Range   Lipase 17 (L) 22 - 51 U/L   Laboratory interpretation all normal except concentrated urine, mild anemia     Imaging Review Ct Abdomen Pelvis W Contrast  02/22/2015   CLINICAL DATA:  Intermittent epigastric and upper abdominal pain for 4 days. Black stools. Nausea.  EXAM: CT ABDOMEN AND PELVIS WITH CONTRAST  TECHNIQUE: Multidetector CT imaging of the abdomen and pelvis was performed using the standard protocol following bolus administration of intravenous contrast.  CONTRAST:  25mL OMNIPAQUE IOHEXOL 300 MG/ML SOLN, OMNIPAQUE IOHEXOL 300 MG/ML SOLN  COMPARISON:  None.  FINDINGS: Lower chest:  The included lung bases are clear.  Liver: Minimal periportal edema, likely related to hydration status. No focal lesion. Liver is normal in size.  Hepatobiliary: Gallbladder only minimally distended. No biliary dilatation.  Pancreas: Normal.  Spleen: Normal.  Adrenal glands: No nodule.  Kidneys: Symmetric renal enhancement. No hydronephrosis. No focal renal abnormality.  Stomach/Bowel: Stomach physiologically distended. There are no dilated or thickened small bowel loops. Small volume of stool throughout the colon without colonic wall thickening. The appendix is normal.  Vascular/Lymphatic: No retroperitoneal adenopathy. Abdominal aorta is normal in caliber. IVC is well distended.  Reproductive: The uterus is retroverted. There is prominent periuterine and  adnexal vascularity. Prominence of both ovarian veins measures 6 mm bilaterally. There  is no adnexal mass.  Bladder: Near completely decompressed.  Other: No free air, free fluid, or intra-abdominal fluid collection.  Musculoskeletal: There are no acute or suspicious osseous abnormalities.  IMPRESSION: 1. No acute abnormality in the abdomen/pelvis. 2. Prominent periuterine and adnexal vascularity in a ovarian veins. This can be seen in the setting of pelvic congestion syndrome.   Electronically Signed   By: Rubye Oaks M.D.   On: 02/22/2015 00:28     EKG Interpretation None      MDM   Final diagnoses:  Upper abdominal pain    New Prescriptions   OMEPRAZOLE (PRILOSEC) 20 MG CAPSULE    Take 1 po BID x 2 weeks then once a day   ONDANSETRON (ZOFRAN) 4 MG TABLET    Take 1 tablet (4 mg total) by mouth every 8 (eight) hours as needed for nausea or vomiting.    Plan discharge   Take 1 po BID x 2 weeks then once a day   I personally performed the services described in this documentation, which was scribed in my presence. The recorded information has been reviewed and considered.  Devoria Albe, MD, Concha Pyo, MD 02/22/15 0300

## 2015-02-21 NOTE — ED Notes (Signed)
Patient complaining of abdominal pain, black stools "with a little bit of blood," nausea, and headache x 3 days.

## 2015-02-22 MED ORDER — ONDANSETRON HCL 4 MG PO TABS: 4.0000 mg | ORAL_TABLET | Freq: Three times a day (TID) | ORAL | Status: DC | PRN

## 2015-02-22 MED ORDER — OMEPRAZOLE 20 MG PO CPDR: DELAYED_RELEASE_CAPSULE | ORAL | Status: DC

## 2015-02-22 MED ORDER — FAMOTIDINE IN NACL 20-0.9 MG/50ML-% IV SOLN
20.0000 mg | Freq: Once | INTRAVENOUS | Status: AC
  Administered 2015-02-22: 20 mg via INTRAVENOUS
  Filled 2015-02-22: qty 50

## 2015-02-22 NOTE — Discharge Instructions (Signed)
Your CT scan was normal tonight. Try to drink a lot of fluids. Avoid milk until the diarrhea is gone. Avoid fried, spicy or greasy foods until your abdominal pain is gone (look at the GERD diet). Take the medications as prescribed. Have Dr Neita Carp recheck you this week if you aren't improving, he may need to refer you to a gastroenterologist.

## 2015-02-24 LAB — POC OCCULT BLOOD, ED: Fecal Occult Bld: NEGATIVE

## 2015-02-27 LAB — GI PATHOGEN PANEL BY PCR, STOOL

## 2015-09-05 ENCOUNTER — Other Ambulatory Visit: Payer: Self-pay | Admitting: Obstetrics and Gynecology

## 2015-09-05 DIAGNOSIS — O3680X0 Pregnancy with inconclusive fetal viability, not applicable or unspecified: Secondary | ICD-10-CM

## 2015-09-08 ENCOUNTER — Ambulatory Visit (INDEPENDENT_AMBULATORY_CARE_PROVIDER_SITE_OTHER): Payer: Medicaid Other

## 2015-09-08 DIAGNOSIS — O3680X0 Pregnancy with inconclusive fetal viability, not applicable or unspecified: Secondary | ICD-10-CM

## 2015-09-08 NOTE — Progress Notes (Signed)
[place] 7+1wks,single IUP w/ys,pos fht 120 bpm,normal ov's bilat,crl 11.34mm

## 2015-09-16 ENCOUNTER — Ambulatory Visit (INDEPENDENT_AMBULATORY_CARE_PROVIDER_SITE_OTHER): Payer: Medicaid Other | Admitting: Women's Health

## 2015-09-16 ENCOUNTER — Other Ambulatory Visit: Payer: Medicaid Other

## 2015-09-16 ENCOUNTER — Other Ambulatory Visit (HOSPITAL_COMMUNITY)
Admission: RE | Admit: 2015-09-16 | Discharge: 2015-09-16 | Disposition: A | Discharge status: Home / Self Care | Payer: Medicaid Other | Source: Ambulatory Visit | Attending: Obstetrics & Gynecology | Admitting: Obstetrics & Gynecology

## 2015-09-16 ENCOUNTER — Other Ambulatory Visit: Payer: Self-pay | Admitting: Women's Health

## 2015-09-16 ENCOUNTER — Encounter: Payer: Self-pay | Admitting: Women's Health

## 2015-09-16 VITALS — BP 120/56 | HR 60 | Wt 173.0 lb

## 2015-09-16 DIAGNOSIS — Z3490 Encounter for supervision of normal pregnancy, unspecified, unspecified trimester: Secondary | ICD-10-CM | POA: Insufficient documentation

## 2015-09-16 DIAGNOSIS — Z0283 Encounter for blood-alcohol and blood-drug test: Secondary | ICD-10-CM

## 2015-09-16 DIAGNOSIS — F329 Major depressive disorder, single episode, unspecified: Secondary | ICD-10-CM

## 2015-09-16 DIAGNOSIS — Z113 Encounter for screening for infections with a predominantly sexual mode of transmission: Secondary | ICD-10-CM | POA: Diagnosis present

## 2015-09-16 DIAGNOSIS — Z331 Pregnant state, incidental: Secondary | ICD-10-CM

## 2015-09-16 DIAGNOSIS — Z23 Encounter for immunization: Secondary | ICD-10-CM

## 2015-09-16 DIAGNOSIS — Z3491 Encounter for supervision of normal pregnancy, unspecified, first trimester: Secondary | ICD-10-CM | POA: Diagnosis not present

## 2015-09-16 DIAGNOSIS — Z01419 Encounter for gynecological examination (general) (routine) without abnormal findings: Secondary | ICD-10-CM | POA: Insufficient documentation

## 2015-09-16 DIAGNOSIS — O99331 Smoking (tobacco) complicating pregnancy, first trimester: Secondary | ICD-10-CM

## 2015-09-16 DIAGNOSIS — O3680X0 Pregnancy with inconclusive fetal viability, not applicable or unspecified: Secondary | ICD-10-CM

## 2015-09-16 DIAGNOSIS — Z369 Encounter for antenatal screening, unspecified: Secondary | ICD-10-CM

## 2015-09-16 DIAGNOSIS — Z3682 Encounter for antenatal screening for nuchal translucency: Secondary | ICD-10-CM

## 2015-09-16 DIAGNOSIS — Z1389 Encounter for screening for other disorder: Secondary | ICD-10-CM

## 2015-09-16 DIAGNOSIS — F32A Depression, unspecified: Secondary | ICD-10-CM

## 2015-09-16 DIAGNOSIS — F17200 Nicotine dependence, unspecified, uncomplicated: Secondary | ICD-10-CM | POA: Diagnosis not present

## 2015-09-16 DIAGNOSIS — Z124 Encounter for screening for malignant neoplasm of cervix: Secondary | ICD-10-CM

## 2015-09-16 LAB — POCT URINALYSIS DIPSTICK
Blood, UA: NEGATIVE
GLUCOSE UA: NEGATIVE
Ketones, UA: NEGATIVE
LEUKOCYTES UA: NEGATIVE
NITRITE UA: NEGATIVE
PROTEIN UA: NEGATIVE

## 2015-09-16 NOTE — Progress Notes (Addendum)
Subjective:  SHANAY WOOLMAN is a 25 y.o. [phone] Caucasian female at [redacted]w[redacted]d by LMP c/w 7wk u/s, being seen today for her first obstetrical visit.  Her obstetrical history is significant for smoker: 1ppd prior to pregnancy, now maybe 1 cigarette every other day; C/S x 3, 1st: FTP, 2nd elective repeat, 3rd airlifted from [facility] to [facility] for emergent c/s d/t placental abruption at 34wks w/ fetal distress.  Pregnancy history fully reviewed. H/O depression, no meds/counseling, doing well.  Patient reports feels like she has lost all pregnancy sx- no longer n/v or sore/tender breasts. Denies vb, cramping, uti s/s, abnormal/malodorous vag d/c, or vulvovaginal itching/irritation.  BP 120/56 mmHg  Pulse 60  Wt 173 lb (78.472 kg)  LMP 07/20/2015 (Exact Date)  HISTORY: OB History  Gravida Para Term Preterm AB SAB TAB Ectopic Multiple Living  # Outcome Date GA Lbr Len/2nd Weight Sex Delivery Anes PTL Lv  4 Current           3 Preterm 09/17/13 [redacted]w[redacted]d  5 lb 7.8 oz (2.49 kg) F CS-LTranv Spinal N Y     Comments: airlifted from [facility] to [place], emergent RLTCS for placental abruption w/ 2L total blood loss  2 Term 06/05/09 [redacted]w[redacted]d  7 lb 8 oz (3.402 kg) F CS-LTranv Spinal N Y  1 Term 06/21/07 [redacted]w[redacted]d  8 lb 9 oz (3.884 kg) M CS-LTranv EPI N Y     Comments: FTP @ 5cm     Past Medical History  Diagnosis Date  . Depression   . History of trichomoniasis    Past Surgical History  Procedure Laterality Date  . Cesarean section    . Hernia repair      pt was 35 weeks old   Family History  Problem Relation Age of Onset  . Thyroid disease Mother   . Cancer Maternal Grandmother     brain  . Cancer Other     lung  . Cancer Other     brain    Exam   System:     General: Well developed & nourished, no acute distress   Skin: Warm & dry, normal coloration and turgor, no rashes   Neurologic: Alert & oriented, normal mood   Cardiovascular: Regular rate & rhythm   Respiratory: Effort & rate  normal, LCTAB, acyanotic   Abdomen: Soft, non tender   Extremities: normal strength, tone   Pelvic Exam:    Perineum: Normal perineum   Vulva: Normal, no lesions   Vagina:  Normal mucosa, normal discharge   Cervix: Normal, bulbous, appears closed   Uterus: Normal size/shape/contour for GA   Thin prep pap smear obtained w/ reflex high risk HPV cotesting Unable to get good pic w/ bs u/s, so will work in w/ Hospital doctor for [facility] check 1125: FHR 168 via u/s w/ amber  Assessment:   Pregnancy: [id number] Patient Active Problem List   Diagnosis Date Noted  . Supervision of normal pregnancy 09/16/2015    Priority: High  . H/O cesarean section 10/17/2013    Priority: High  . Abnormal Pap smear of cervix 09/17/2013    Priority: High  . 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 Marijuana use 09/10/2013    Priority: High  . Smoker 09/10/2013    Priority: High  . Trichomonal vaginitis in pregnancy in first trimester 09/10/2013    Priority: High  . Depression 09/16/2015  . Post-operative state 10/01/2013  [redacted]w[redacted]d [id number] New OB visit Smoker H/O 34wk c/s d/t abruption H/O c/s x 3 Depression, no meds  Plan:  Initial labs drawn Continue prenatal vitamins Problem list reviewed and updated Reviewed n/v relief measures and warning s/s to report Reviewed recommended weight gain based on pre-gravid BMI Encouraged well-balanced diet Genetic Screening discussed Integrated Screen: requested Cystic fibrosis screening discussed neg prev preg Ultrasound discussed; fetal survey: requested Follow up in 4 weeks for 1st it/nt and visit [facility] completed Smokes 1 cigarette every other day, advised cessation, discussed risks to fetus while pregnant, to infant pp, and to herself.   Marge Duncans CNM, [facility] 09/16/2015 11:10 AM

## 2015-09-16 NOTE — Patient Instructions (Signed)

## 2015-09-17 LAB — URINALYSIS, ROUTINE W REFLEX MICROSCOPIC
Bilirubin, UA: NEGATIVE
Glucose, UA: NEGATIVE
Ketones, UA: NEGATIVE
LEUKOCYTES UA: NEGATIVE
NITRITE UA: NEGATIVE
Protein, UA: NEGATIVE
RBC UA: NEGATIVE
SPEC GRAV UA: 1.024 (ref 1.005–1.030)
Urobilinogen, Ur: 0.2 mg/dL (ref 0.2–1.0)
pH, UA: 7 (ref 5.0–7.5)

## 2015-09-17 LAB — ABO/RH: Rh Factor: POSITIVE

## 2015-09-17 LAB — CBC
HEMOGLOBIN: 13.1 g/dL (ref 11.1–15.9)
Hematocrit: 39.3 % (ref 34.0–46.6)
MCH: 30 pg (ref 26.6–33.0)
MCHC: 33.3 g/dL (ref 31.5–35.7)
MCV: 90 fL (ref 79–97)
PLATELETS: 254 10*3/uL (ref 150–379)
RBC: 4.36 x10E6/uL (ref 3.77–5.28)
RDW: 13.6 % (ref 12.3–15.4)
WBC: 8.3 10*3/uL (ref 3.4–10.8)

## 2015-09-17 LAB — ANTIBODY SCREEN: Antibody Screen: NEGATIVE

## 2015-09-17 LAB — URINE CULTURE

## 2015-09-17 LAB — PMP SCREEN PROFILE (10S), URINE
Amphetamine Screen, Ur: NEGATIVE ng/mL
BARBITURATE SCRN UR: NEGATIVE ng/mL
Benzodiazepine Screen, Urine: NEGATIVE ng/mL
Cannabinoids Ur Ql Scn: POSITIVE ng/mL
Cocaine(Metab.)Screen, Urine: NEGATIVE ng/mL
Creatinine(Crt), U: 103.4 mg/dL (ref 20.0–300.0)
METHADONE SCREEN, URINE: NEGATIVE ng/mL
OPIATE SCRN UR: NEGATIVE ng/mL
Oxycodone+Oxymorphone Ur Ql Scn: NEGATIVE ng/mL
PCP Scrn, Ur: NEGATIVE ng/mL
PH UR, DRUG SCRN: 6.9 (ref 4.5–8.9)
PROPOXYPHENE SCREEN: NEGATIVE ng/mL

## 2015-09-17 LAB — VARICELLA ZOSTER ANTIBODY, IGG: VARICELLA: 1595 {index} (ref >165)

## 2015-09-17 LAB — HEPATITIS B SURFACE ANTIGEN: Hepatitis B Surface Ag: NEGATIVE

## 2015-09-17 LAB — RUBELLA SCREEN: RUBELLA: 1.55 {index} (ref >0.99)

## 2015-09-17 LAB — RPR: RPR Ser Ql: NONREACTIVE

## 2015-09-17 LAB — HIV ANTIBODY (ROUTINE TESTING W REFLEX): HIV Screen 4th Generation wRfx: NONREACTIVE

## 2015-09-18 LAB — CYTOLOGY - PAP

## 2015-10-14 ENCOUNTER — Ambulatory Visit (INDEPENDENT_AMBULATORY_CARE_PROVIDER_SITE_OTHER): Payer: Medicaid Other

## 2015-10-14 ENCOUNTER — Encounter: Payer: Self-pay | Admitting: Women's Health

## 2015-10-14 ENCOUNTER — Ambulatory Visit (INDEPENDENT_AMBULATORY_CARE_PROVIDER_SITE_OTHER): Payer: Medicaid Other | Admitting: Women's Health

## 2015-10-14 VITALS — BP 120/56 | HR 76 | Wt 177.0 lb

## 2015-10-14 DIAGNOSIS — Z3682 Encounter for antenatal screening for nuchal translucency: Secondary | ICD-10-CM

## 2015-10-14 DIAGNOSIS — N9489 Other specified conditions associated with female genital organs and menstrual cycle: Secondary | ICD-10-CM

## 2015-10-14 DIAGNOSIS — Z331 Pregnant state, incidental: Secondary | ICD-10-CM | POA: Diagnosis not present

## 2015-10-14 DIAGNOSIS — Z36 Encounter for antenatal screening of mother: Secondary | ICD-10-CM | POA: Diagnosis not present

## 2015-10-14 DIAGNOSIS — F1290 Cannabis use, unspecified, uncomplicated: Secondary | ICD-10-CM

## 2015-10-14 DIAGNOSIS — R768 Other specified abnormal immunological findings in serum: Secondary | ICD-10-CM

## 2015-10-14 DIAGNOSIS — Z1389 Encounter for screening for other disorder: Secondary | ICD-10-CM

## 2015-10-14 DIAGNOSIS — Z3491 Encounter for supervision of normal pregnancy, unspecified, first trimester: Secondary | ICD-10-CM

## 2015-10-14 LAB — POCT URINALYSIS DIPSTICK
Blood, UA: NEGATIVE
Glucose, UA: NEGATIVE
KETONES UA: NEGATIVE
LEUKOCYTES UA: NEGATIVE
Nitrite, UA: NEGATIVE

## 2015-10-14 LAB — POCT WET PREP (WET MOUNT): CLUE CELLS WET PREP WHIFF POC: NEGATIVE

## 2015-10-14 MED ORDER — PRENATAL 27-0.8 MG PO TABS: 1.0000 | ORAL_TABLET | Freq: Every day | ORAL | Status: DC

## 2015-10-14 MED ORDER — DOXYLAMINE-PYRIDOXINE 10-10 MG PO TBEC: DELAYED_RELEASE_TABLET | ORAL | Status: DC

## 2015-10-14 NOTE — Progress Notes (Signed)
Low-risk OB appointment [id number] 2125w2d Estimated Date of Delivery: 04/25/16 BP 120/56 mmHg  Pulse 76  Wt 177 lb (80.287 kg)  LMP 07/20/2015 (Exact Date)  BP, weight, and urine reviewed.  Refer to obstetrical flow sheet for FH & FHR.  No fm yet. Denies cramping, lof, vb, or uti s/s. Burning after sex. No abnormal d/c, itching/odor. Can't quit THC b/c she is nauseated/no appetite. Rx diclegis to help w/ nausea, stop THC.  Spec exam: cx closed, small amt creamy white nonodorous d/c, wet prep normal. Will check gc/ct, if neg- will try mycolog Reviewed today's normal nt u/s, warning s/s to report. Plan:  Continue routine obstetrical care  F/U in 4wks for OB appointment and 2nd IT 1st IT/NT today

## 2015-10-14 NOTE — Progress Notes (Signed)
US 12+2wks,measurements c/w dates,normal ov's bilat,crl 60.3 mm,fhr 153 bpm,ant pl gr 0,NB present,NT 1.5 mm

## 2015-10-15 LAB — GC/CHLAMYDIA PROBE AMP
Chlamydia trachomatis, NAA: NEGATIVE
NEISSERIA GONORRHOEAE BY PCR: NEGATIVE

## 2015-10-16 LAB — MATERNAL SCREEN, INTEGRATED #1
Crown Rump Length: 60.3 mm
Gest. Age on Collection Date: 12.4 weeks
Maternal Age at EDD: 23.8 years
NUCHAL TRANSLUCENCY (NT): 1.5 mm
Number of Fetuses: 1
PAPP-A Value: 1104.7 ng/mL
Weight: 177 [lb_av]

## 2015-11-11 ENCOUNTER — Encounter: Payer: Self-pay | Admitting: Women's Health

## 2015-11-11 ENCOUNTER — Ambulatory Visit (INDEPENDENT_AMBULATORY_CARE_PROVIDER_SITE_OTHER): Payer: Medicaid Other | Admitting: Women's Health

## 2015-11-11 VITALS — BP 130/64 | HR 64 | Wt 178.0 lb

## 2015-11-11 DIAGNOSIS — Z3492 Encounter for supervision of normal pregnancy, unspecified, second trimester: Secondary | ICD-10-CM

## 2015-11-11 DIAGNOSIS — Z3682 Encounter for antenatal screening for nuchal translucency: Secondary | ICD-10-CM

## 2015-11-11 DIAGNOSIS — Z3A17 17 weeks gestation of pregnancy: Secondary | ICD-10-CM

## 2015-11-11 DIAGNOSIS — Z1389 Encounter for screening for other disorder: Secondary | ICD-10-CM

## 2015-11-11 DIAGNOSIS — Z331 Pregnant state, incidental: Secondary | ICD-10-CM

## 2015-11-11 DIAGNOSIS — O210 Mild hyperemesis gravidarum: Secondary | ICD-10-CM

## 2015-11-11 DIAGNOSIS — Z363 Encounter for antenatal screening for malformations: Secondary | ICD-10-CM

## 2015-11-11 DIAGNOSIS — Z3482 Encounter for supervision of other normal pregnancy, second trimester: Secondary | ICD-10-CM

## 2015-11-11 LAB — POCT URINALYSIS DIPSTICK
GLUCOSE UA: NEGATIVE
Ketones, UA: NEGATIVE
LEUKOCYTES UA: NEGATIVE
NITRITE UA: NEGATIVE
Protein, UA: NEGATIVE
RBC UA: NEGATIVE

## 2015-11-11 NOTE — Progress Notes (Signed)
Low-risk OB appointment [id number] 5462w2d Estimated Date of Delivery: 04/25/16 BP 130/64 mmHg  Pulse 64  Wt 178 lb (80.74 kg)  LMP 07/20/2015 (Exact Date)  BP, weight, and urine reviewed.  Refer to obstetrical flow sheet for FH & FHR.  No fm yet. Denies cramping, lof, vb, or uti s/s. Stopped THC 6d ago, decreased appetite. Has gotten up to 3 diclegis/day- to try 4/day. Eat small frequent snacks w/ protein.  Reviewed warning s/s to report. Plan:  Continue routine obstetrical care  F/U in 3wks for OB appointment and anatomy u/s 2nd IT today

## 2015-11-11 NOTE — Patient Instructions (Signed)

## 2015-11-13 LAB — MATERNAL SCREEN, INTEGRATED #2
ADSF: 0.62
AFP MARKER: 31.2 ng/mL
AFP MOM: 1.08
CROWN RUMP LENGTH: 60.3 mm
DIA MoM: 1.6
DIA Value: 252.5 pg/mL
ESTRIOL UNCONJUGATED: 0.53 ng/mL
GEST. AGE ON COLLECTION DATE: 12.4 wk
GESTATIONAL AGE: 16.4 wk
MATERNAL AGE AT EDD: 23.8 a
Nuchal Translucency (NT): 1.5 mm
Nuchal Translucency MoM: 1.02
Number of Fetuses: 1
PAPP-A MoM: 1.45
PAPP-A Value: 1104.7 ng/mL
Test Results:: NEGATIVE
WEIGHT: 177 [lb_av]
WEIGHT: 178 [lb_av]
hCG MoM: 0.78
hCG Value: 22.8 IU/mL

## 2015-12-02 ENCOUNTER — Ambulatory Visit (INDEPENDENT_AMBULATORY_CARE_PROVIDER_SITE_OTHER): Payer: Medicaid Other | Admitting: Women's Health

## 2015-12-02 ENCOUNTER — Ambulatory Visit (INDEPENDENT_AMBULATORY_CARE_PROVIDER_SITE_OTHER): Payer: Medicaid Other

## 2015-12-02 ENCOUNTER — Encounter: Payer: Self-pay | Admitting: Women's Health

## 2015-12-02 VITALS — BP 120/60 | HR 64 | Wt 182.0 lb

## 2015-12-02 DIAGNOSIS — Z331 Pregnant state, incidental: Secondary | ICD-10-CM

## 2015-12-02 DIAGNOSIS — Z363 Encounter for antenatal screening for malformations: Secondary | ICD-10-CM

## 2015-12-02 DIAGNOSIS — Z1389 Encounter for screening for other disorder: Secondary | ICD-10-CM

## 2015-12-02 DIAGNOSIS — Z36 Encounter for antenatal screening of mother: Secondary | ICD-10-CM

## 2015-12-02 DIAGNOSIS — Z3492 Encounter for supervision of normal pregnancy, unspecified, second trimester: Secondary | ICD-10-CM

## 2015-12-02 DIAGNOSIS — Z3A20 20 weeks gestation of pregnancy: Secondary | ICD-10-CM | POA: Diagnosis not present

## 2015-12-02 LAB — POCT URINALYSIS DIPSTICK
GLUCOSE UA: NEGATIVE
Ketones, UA: NEGATIVE
LEUKOCYTES UA: NEGATIVE
NITRITE UA: NEGATIVE
Protein, UA: NEGATIVE
RBC UA: NEGATIVE

## 2015-12-02 MED ORDER — PRENATAL 27-0.8 MG PO TABS: 1.0000 | ORAL_TABLET | Freq: Every day | ORAL | Status: DC

## 2015-12-02 NOTE — Patient Instructions (Signed)

## 2015-12-02 NOTE — Progress Notes (Signed)
US 19+2 wks,cx 3.5 cm,cephalic,ant pl gr 0,svp of fluid 5.3 cm,normal ov's bilat,fhr 142 bpm,efw 296 g,measurements c/w dates,anatomy complete,no obvious abnormalities seen

## 2015-12-02 NOTE — Progress Notes (Signed)
Low-risk OB appointment [id number] 4363w2d Estimated Date of Delivery: 04/25/16 BP 120/60 mmHg  Pulse 64  Wt 182 lb (82.555 kg)  LMP 07/20/2015 (Exact Date)  BP, weight, and urine reviewed.  Refer to obstetrical flow sheet for FH & FHR.  Reports good fm.  Denies regular uc's, lof, vb, or uti s/s. No complaints. Reviewed today's normal anatomy u/s, warning s/s to report. Plan:  Continue routine obstetrical care  F/U in 4wks for OB appointment

## 2015-12-30 ENCOUNTER — Ambulatory Visit (INDEPENDENT_AMBULATORY_CARE_PROVIDER_SITE_OTHER): Payer: Medicaid Other | Admitting: Women's Health

## 2015-12-30 ENCOUNTER — Encounter: Payer: Self-pay | Admitting: Women's Health

## 2015-12-30 VITALS — BP 124/58 | HR 72 | Wt 188.0 lb

## 2015-12-30 DIAGNOSIS — Z3492 Encounter for supervision of normal pregnancy, unspecified, second trimester: Secondary | ICD-10-CM

## 2015-12-30 DIAGNOSIS — Z1389 Encounter for screening for other disorder: Secondary | ICD-10-CM

## 2015-12-30 DIAGNOSIS — Z331 Pregnant state, incidental: Secondary | ICD-10-CM

## 2015-12-30 DIAGNOSIS — F1290 Cannabis use, unspecified, uncomplicated: Secondary | ICD-10-CM

## 2015-12-30 LAB — POCT URINALYSIS DIPSTICK
Blood, UA: NEGATIVE
GLUCOSE UA: NEGATIVE
KETONES UA: NEGATIVE
LEUKOCYTES UA: NEGATIVE
Nitrite, UA: NEGATIVE
Protein, UA: NEGATIVE

## 2015-12-30 NOTE — Progress Notes (Signed)
Low-risk OB appointment [id number] 6142w2d Estimated Date of Delivery: 04/25/16 BP 124/58 mmHg  Pulse 72  Wt 188 lb (85.276 kg)  LMP 07/20/2015 (Exact Date)  BP, weight, and urine reviewed.  Refer to obstetrical flow sheet for FH & FHR.  Reports good fm.  Denies regular uc's, lof, vb, or uti s/s. No complaints. Reviewed ptl s/s, fm. Plan:  Continue routine obstetrical care  F/U in 4wks for OB appointment and pn2

## 2015-12-30 NOTE — Patient Instructions (Signed)
You will have your sugar test next visit.  Please do not eat or drink anything after midnight the night before you come, not even water.  You will be here for at least two hours.     Call the office ([phone]) or go to [facility] if:  You begin to have strong, frequent contractions  Your water breaks.  Sometimes it is a big gush of fluid, sometimes it is just a trickle that keeps getting your panties wet or running down your legs  You have vaginal bleeding.  It is normal to have a small amount of spotting if your cervix was checked.   You don't feel your baby moving like normal.  If you don't, get you something to eat and drink and lay down and focus on feeling your baby move.   If your baby is still not moving like normal, you should call the office or go to [facility].  Second Trimester of Pregnancy The second trimester is from week 13 through week 28, months 4 through 6. The second trimester is often a time when you feel your best. Your body has also adjusted to being pregnant, and you begin to feel better physically. Usually, morning sickness has lessened or quit completely, you may have more energy, and you may have an increase in appetite. The second trimester is also a time when the fetus is growing rapidly. At the end of the sixth month, the fetus is about 9 inches long and weighs about 1 pounds. You will likely begin to feel the baby move (quickening) between 18 and 20 weeks of the pregnancy. BODY CHANGES Your body goes through many changes during pregnancy. The changes vary from woman to woman.   Your weight will continue to increase. You will notice your lower abdomen bulging out.  You may begin to get stretch marks on your hips, abdomen, and breasts.  You may develop headaches that can be relieved by medicines approved by your health care provider.  You may urinate more often because the fetus is pressing on your bladder.  You may develop or continue to have  heartburn as a result of your pregnancy.  You may develop constipation because certain hormones are causing the muscles that push waste through your intestines to slow down.  You may develop hemorrhoids or swollen, bulging veins (varicose veins).  You may have back pain because of the weight gain and pregnancy hormones relaxing your joints between the bones in your pelvis and as a result of a shift in weight and the muscles that support your balance.  Your breasts will continue to grow and be tender.  Your gums may bleed and may be sensitive to brushing and flossing.  Dark spots or blotches (chloasma, mask of pregnancy) may develop on your face. This will likely fade after the baby is born.  A dark line from your belly button to the pubic area (linea nigra) may appear. This will likely fade after the baby is born.  You may have changes in your hair. These can include thickening of your hair, rapid growth, and changes in texture. Some women also have hair loss during or after pregnancy, or hair that feels dry or thin. Your hair will most likely return to normal after your baby is born. WHAT TO EXPECT AT YOUR PRENATAL VISITS During a routine prenatal visit:  You will be weighed to make sure you and the fetus are growing normally.  Your blood pressure will be taken.    Your abdomen will be measured to track your baby's growth.  The fetal heartbeat will be listened to.  Any test results from the previous visit will be discussed. Your health care provider may ask you:  How you are feeling.  If you are feeling the baby move.  If you have had any abnormal symptoms, such as leaking fluid, bleeding, severe headaches, or abdominal cramping.  If you have any questions. Other tests that may be performed during your second trimester include:  Blood tests that check for:  Low iron levels (anemia).  Gestational diabetes (between 24 and 28 weeks).  Rh antibodies.  Urine tests to check  for infections, diabetes, or protein in the urine.  An ultrasound to confirm the proper growth and development of the baby.  An amniocentesis to check for possible genetic problems.  Fetal screens for spina bifida and Down syndrome. HOME CARE INSTRUCTIONS   Avoid all smoking, herbs, alcohol, and unprescribed drugs. These chemicals affect the formation and growth of the baby.  Follow your health care provider's instructions regarding medicine use. There are medicines that are either safe or unsafe to take during pregnancy.  Exercise only as directed by your health care provider. Experiencing uterine cramps is a good sign to stop exercising.  Continue to eat regular, healthy meals.  Wear a good support bra for breast tenderness.  Do not use hot tubs, steam rooms, or saunas.  Wear your seat belt at all times when driving.  Avoid raw meat, uncooked cheese, cat litter boxes, and soil used by cats. These carry germs that can cause birth defects in the baby.  Take your prenatal vitamins.  Try taking a stool softener (if your health care provider approves) if you develop constipation. Eat more high-fiber foods, such as fresh vegetables or fruit and whole grains. Drink plenty of fluids to keep your urine clear or pale yellow.  Take warm sitz baths to soothe any pain or discomfort caused by hemorrhoids. Use hemorrhoid cream if your health care provider approves.  If you develop varicose veins, wear support hose. Elevate your feet for 15 minutes, 3-4 times a day. Limit salt in your diet.  Avoid heavy lifting, wear low heel shoes, and practice good posture.  Rest with your legs elevated if you have leg cramps or low back pain.  Visit your dentist if you have not gone yet during your pregnancy. Use a soft toothbrush to brush your teeth and be gentle when you floss.  A sexual relationship may be continued unless your health care provider directs you otherwise.  Continue to go to all your  prenatal visits as directed by your health care provider. SEEK MEDICAL CARE IF:   You have dizziness.  You have mild pelvic cramps, pelvic pressure, or nagging pain in the abdominal area.  You have persistent nausea, vomiting, or diarrhea.  You have a bad smelling vaginal discharge.  You have pain with urination. SEEK IMMEDIATE MEDICAL CARE IF:   You have a fever.  You are leaking fluid from your vagina.  You have spotting or bleeding from your vagina.  You have severe abdominal cramping or pain.  You have rapid weight gain or loss.  You have shortness of breath with chest pain.  You notice sudden or extreme swelling of your face, hands, ankles, feet, or legs.  You have not felt your baby move in over an hour.  You have severe headaches that do not go away with medicine.  You have vision changes.   Document Released: 07/06/2001 Document Revised: 07/17/2013 Document Reviewed: 09/12/2012 [employer] Patient Information 2015 [employer]. This information is not intended to replace advice given to you by your health care provider. Make sure you discuss any questions you have with your health care provider.     

## 2015-12-31 LAB — PMP SCREEN PROFILE (10S), URINE
AMPHETAMINE SCRN UR: NEGATIVE ng/mL
Barbiturate Screen, Ur: NEGATIVE ng/mL
Benzodiazepine Screen, Urine: NEGATIVE ng/mL
COCAINE(METAB.) SCREEN, URINE: NEGATIVE ng/mL
CREATININE(CRT), U: 67.3 mg/dL (ref 20.0–300.0)
Cannabinoids Ur Ql Scn: NEGATIVE ng/mL
METHADONE SCREEN, URINE: NEGATIVE ng/mL
OPIATE SCRN UR: NEGATIVE ng/mL
OXYCODONE+OXYMORPHONE UR QL SCN: NEGATIVE ng/mL
PCP Scrn, Ur: NEGATIVE ng/mL
PROPOXYPHENE SCREEN: NEGATIVE ng/mL
Ph of Urine: 8 (ref 4.5–8.9)

## 2016-01-28 ENCOUNTER — Ambulatory Visit (INDEPENDENT_AMBULATORY_CARE_PROVIDER_SITE_OTHER): Payer: Medicaid Other | Admitting: Women's Health

## 2016-01-28 ENCOUNTER — Other Ambulatory Visit: Payer: Medicaid Other

## 2016-01-28 ENCOUNTER — Encounter: Payer: Self-pay | Admitting: Women's Health

## 2016-01-28 VITALS — BP 120/60 | HR 70 | Wt 197.0 lb

## 2016-01-28 DIAGNOSIS — Z1389 Encounter for screening for other disorder: Secondary | ICD-10-CM

## 2016-01-28 DIAGNOSIS — Z369 Encounter for antenatal screening, unspecified: Secondary | ICD-10-CM

## 2016-01-28 DIAGNOSIS — Z3492 Encounter for supervision of normal pregnancy, unspecified, second trimester: Secondary | ICD-10-CM

## 2016-01-28 DIAGNOSIS — Z131 Encounter for screening for diabetes mellitus: Secondary | ICD-10-CM

## 2016-01-28 DIAGNOSIS — Z331 Pregnant state, incidental: Secondary | ICD-10-CM

## 2016-01-28 DIAGNOSIS — Z8619 Personal history of other infectious and parasitic diseases: Secondary | ICD-10-CM | POA: Insufficient documentation

## 2016-01-28 LAB — POCT URINALYSIS DIPSTICK
Blood, UA: NEGATIVE
GLUCOSE UA: NEGATIVE
KETONES UA: NEGATIVE
LEUKOCYTES UA: NEGATIVE
Nitrite, UA: NEGATIVE
PROTEIN UA: NEGATIVE

## 2016-01-28 NOTE — Patient Instructions (Signed)

## 2016-01-28 NOTE — Progress Notes (Signed)
Low-risk OB appointment [id number] Estimated Date of Delivery: 04/25/16 BP 120/60 mmHg  Pulse 70  Wt 197 lb (89.359 kg)  LMP 07/20/2015 (Exact Date)  BP, weight, and urine reviewed.  Refer to obstetrical flow sheet for FH & FHR.  Reports good fm.  Denies regular uc's, lof, vb, or uti s/s. Feels like her appetite has picked up. Has gained 9lbs since last visit, w/ 27lb total so far, of recommended no more than 20lbs entire pregnancy based on her pre-gravid BMI. Discussed cutting back on carbs, increasing exercise.  Reviewed ptl s/s, fkc. Recommended Tdap at HD/PCP per CDC guidelines.  Plan:  Continue routine obstetrical care  F/U in 3wks for OB appointment  PN2 today

## 2016-01-29 LAB — CBC
HEMATOCRIT: 34.1 % (ref 34.0–46.6)
Hemoglobin: 11.5 g/dL (ref 11.1–15.9)
MCH: 30.6 pg (ref 26.6–33.0)
MCHC: 33.7 g/dL (ref 31.5–35.7)
MCV: 91 fL (ref 79–97)
PLATELETS: 200 10*3/uL (ref 150–379)
RBC: 3.76 x10E6/uL — ABNORMAL LOW (ref 3.77–5.28)
RDW: 13.1 % (ref 12.3–15.4)
WBC: 9.5 10*3/uL (ref 3.4–10.8)

## 2016-01-29 LAB — GLUCOSE TOLERANCE, 2 HOURS W/ 1HR
GLUCOSE, 2 HOUR: 64 mg/dL — AB (ref 65–152)
Glucose, 1 hour: 135 mg/dL (ref 65–179)
Glucose, Fasting: 77 mg/dL (ref 65–91)

## 2016-01-29 LAB — RPR: RPR Ser Ql: NONREACTIVE

## 2016-01-29 LAB — ANTIBODY SCREEN: ANTIBODY SCREEN: NEGATIVE

## 2016-01-29 LAB — HIV ANTIBODY (ROUTINE TESTING W REFLEX): HIV Screen 4th Generation wRfx: NONREACTIVE

## 2016-02-18 ENCOUNTER — Encounter: Payer: Self-pay | Admitting: Women's Health

## 2016-02-18 ENCOUNTER — Other Ambulatory Visit: Payer: Self-pay | Admitting: Women's Health

## 2016-02-18 ENCOUNTER — Ambulatory Visit (INDEPENDENT_AMBULATORY_CARE_PROVIDER_SITE_OTHER): Payer: Medicaid Other | Admitting: Women's Health

## 2016-02-18 VITALS — BP 110/70 | HR 72 | Wt 195.4 lb

## 2016-02-18 DIAGNOSIS — Z331 Pregnant state, incidental: Secondary | ICD-10-CM

## 2016-02-18 DIAGNOSIS — Z1389 Encounter for screening for other disorder: Secondary | ICD-10-CM

## 2016-02-18 DIAGNOSIS — Z3A34 34 weeks gestation of pregnancy: Secondary | ICD-10-CM

## 2016-02-18 DIAGNOSIS — Z3493 Encounter for supervision of normal pregnancy, unspecified, third trimester: Secondary | ICD-10-CM

## 2016-02-18 DIAGNOSIS — Z3483 Encounter for supervision of other normal pregnancy, third trimester: Secondary | ICD-10-CM

## 2016-02-18 LAB — POCT URINALYSIS DIPSTICK
Glucose, UA: NEGATIVE
KETONES UA: NEGATIVE
Leukocytes, UA: NEGATIVE
Nitrite, UA: NEGATIVE
PROTEIN UA: NEGATIVE
RBC UA: NEGATIVE

## 2016-02-18 NOTE — Progress Notes (Signed)
Low-risk OB appointment [id number] [redacted]w[redacted]d Estimated Date of Delivery: 04/25/16 BP 110/70   Pulse 72   Wt 195 lb 6.4 oz (88.6 kg)   LMP 07/20/2015 (Exact Date)   BMI 35.74 kg/m   BP, weight, and urine reviewed.  Refer to obstetrical flow sheet for FH & FHR.  Reports good fm.  Denies regular uc's, lof, vb, or uti s/s. Sciatica- gave printed exercises/relief measures Reviewed ptl s/s, fkc, pn2 results. Plan:  Continue routine obstetrical care  F/U in 2wks for OB appointment

## 2016-02-18 NOTE — Patient Instructions (Addendum)
Call the office [phone]) or go to [facility] if:  You begin to have strong, frequent contractions  Your water breaks.  Sometimes it is a big gush of fluid, sometimes it is just a trickle that keeps getting your panties wet or running down your legs  You have vaginal bleeding.  It is normal to have a small amount of spotting if your cervix was checked.   You don't feel your baby moving like normal.  If you don't, get you something to eat and drink and lay down and focus on feeling your baby move.  You should feel at least 10 movements in 2 hours.  If you don't, you should call the office or go to [place] Preterm labor is when labor starts at less than 37 weeks of pregnancy. The normal length of a pregnancy is 39 to 41 weeks. CAUSES Often, there is no identifiable underlying cause as to why a woman goes into preterm labor. One of the most common known causes of preterm labor is infection. Infections of the uterus, cervix, vagina, amniotic sac, bladder, kidney, or even the lungs (pneumonia) can cause labor to start. Other suspected causes of preterm labor include:   Urogenital infections, such as yeast infections and bacterial vaginosis.   Uterine abnormalities (uterine shape, uterine septum, fibroids, or bleeding from the placenta).   A cervix that has been operated on (it may fail to stay closed).   Malformations in the fetus.   Multiple gestations (twins, triplets, and so on).   Breakage of the amniotic sac.  RISK FACTORS  Having a previous history of preterm labor.   Having premature rupture of membranes (PROM).   Having a placenta that covers the opening of the cervix (placenta previa).   Having a placenta that separates from the uterus (placental abruption).   Having a cervix that is too weak to hold the fetus in the uterus (incompetent cervix).   Having too much fluid in the amniotic sac (polyhydramnios).   Taking  illegal drugs or smoking while pregnant.   Not gaining enough weight while pregnant.   Being younger than 60 and older than 24 years old.   Having a low socioeconomic status.   Being African American. SYMPTOMS Signs and symptoms of preterm labor include:   Menstrual-like cramps, abdominal pain, or back pain.  Uterine contractions that are regular, as frequent as six in an hour, regardless of their intensity (may be mild or painful).  Contractions that start on the top of the uterus and spread down to the lower abdomen and back.   A sense of increased pelvic pressure.   A watery or bloody mucus discharge that comes from the vagina.  TREATMENT Depending on the length of the pregnancy and other circumstances, your health care provider may suggest bed rest. If necessary, there are medicines that can be given to stop contractions and to mature the fetal lungs. If labor happens before 34 weeks of pregnancy, a prolonged hospital stay may be recommended. Treatment depends on the condition of both you and the fetus.  WHAT SHOULD YOU DO IF YOU THINK YOU ARE IN PRETERM LABOR? Call your health care provider right away. You will need to go to the hospital to get checked immediately. HOW CAN YOU PREVENT PRETERM LABOR IN FUTURE PREGNANCIES? You should:   Stop smoking if you smoke.  Maintain healthy weight gain and avoid chemicals and drugs that are not necessary.  Be watchful for  any type of infection.  Inform your health care provider if you have a known history of preterm labor.   This information is not intended to replace advice given to you by your health care provider. Make sure you discuss any questions you have with your health care provider.   Document Released: 10/02/2003 Document Revised: 03/14/2013 Document Reviewed: 08/14/2012 Elsevier Interactive Patient Education 2016 [employer]   Sciatica With Rehab The sciatic nerve runs from the back down the leg and is  responsible for sensation and control of the muscles in the back (posterior) side of the thigh, lower leg, and foot. Sciatica is a condition that is characterized by inflammation of this nerve.  SYMPTOMS   Signs of nerve damage, including numbness and/or weakness along the posterior side of the lower extremity.  Pain in the back of the thigh that may also travel down the leg.  Pain that worsens when sitting for long periods of time.  Occasionally, pain in the back or buttock. CAUSES  Inflammation of the sciatic nerve is the cause of sciatica. The inflammation is due to something irritating the nerve. Common sources of irritation include:  Sitting for long periods of time.  Direct trauma to the nerve.  Arthritis of the spine.  Herniated or ruptured disk.  Slipping of the vertebrae (spondylolisthesis).  Pressure from soft tissues, such as muscles or ligament-like tissue (fascia). RISK INCREASES WITH:  Sports that place pressure or stress on the spine (football or weightlifting).  Poor strength and flexibility.  Failure to warm up properly before activity.  Family history of low back pain or disk disorders.  Previous back injury or surgery.  Poor body mechanics, especially when lifting, or poor posture. PREVENTION   Warm up and stretch properly before activity.  Maintain physical fitness:  Strength, flexibility, and endurance.  Cardiovascular fitness.  Learn and use proper technique, especially with posture and lifting. When possible, have coach correct improper technique.  Avoid activities that place stress on the spine. PROGNOSIS If treated properly, then sciatica usually resolves within 6 weeks. However, occasionally surgery is necessary.  RELATED COMPLICATIONS   Permanent nerve damage, including pain, numbness, tingle, or weakness.  Chronic back pain.  Risks of surgery: infection, bleeding, nerve damage, or damage to surrounding  tissues. TREATMENT Treatment initially involves resting from any activities that aggravate your symptoms. The use of ice and medication may help reduce pain and inflammation. The use of strengthening and stretching exercises may help reduce pain with activity. These exercises may be performed at home or with referral to a therapist. A therapist may recommend further treatments, such as transcutaneous electronic nerve stimulation (TENS) or ultrasound. Your caregiver may recommend corticosteroid injections to help reduce inflammation of the sciatic nerve. If symptoms persist despite non-surgical (conservative) treatment, then surgery may be recommended. MEDICATION  If pain medication is necessary, then nonsteroidal anti-inflammatory medications, such as aspirin and ibuprofen, or other minor pain relievers, such as acetaminophen, are often recommended.  Do not take pain medication for 7 days before surgery.  Prescription pain relievers may be given if deemed necessary by your caregiver. Use only as directed and only as much as you need.  Ointments applied to the skin may be helpful.  Corticosteroid injections may be given by your caregiver. These injections should be reserved for the most serious cases, because they may only be given a certain number of times. HEAT AND COLD  Cold treatment (icing) relieves pain and reduces inflammation. Cold treatment should be applied  for 10 to 15 minutes every 2 to 3 hours for inflammation and pain and immediately after any activity that aggravates your symptoms. Use ice packs or massage the area with a piece of ice (ice massage).  Heat treatment may be used prior to performing the stretching and strengthening activities prescribed by your caregiver, physical therapist, or athletic trainer. Use a heat pack or soak the injury in warm water. SEEK MEDICAL CARE IF:  Treatment seems to offer no benefit, or the condition worsens.  Any medications produce adverse side  effects. EXERCISES  RANGE OF MOTION (ROM) AND STRETCHING EXERCISES - Sciatica Most people with sciatic will find that their symptoms worsen with either excessive bending forward (flexion) or arching at the low back (extension). The exercises which will help resolve your symptoms will focus on the opposite motion. Your physician, physical therapist or athletic trainer will help you determine which exercises will be most helpful to resolve your low back pain. Do not complete any exercises without first consulting with your clinician. Discontinue any exercises which worsen your symptoms until you speak to your clinician. If you have pain, numbness or tingling which travels down into your buttocks, leg or foot, the goal of the therapy is for these symptoms to move closer to your back and eventually resolve. Occasionally, these leg symptoms will get better, but your low back pain may worsen; this is typically an indication of progress in your rehabilitation. Be certain to be very alert to any changes in your symptoms and the activities in which you participated in the 24 hours prior to the change. Sharing this information with your clinician will allow him/her to most efficiently treat your condition. These exercises may help you when beginning to rehabilitate your injury. Your symptoms may resolve with or without further involvement from your physician, physical therapist or athletic trainer. While completing these exercises, remember:   Restoring tissue flexibility helps normal motion to return to the joints. This allows healthier, less painful movement and activity.  An effective stretch should be held for at least 30 seconds.  A stretch should never be painful. You should only feel a gentle lengthening or release in the stretched tissue. FLEXION RANGE OF MOTION AND STRETCHING EXERCISES: STRETCH - Flexion, Single Knee to Chest   Lie on a firm bed or floor with both legs extended in front of  you.  Keeping one leg in contact with the floor, bring your opposite knee to your chest. Hold your leg in place by either grabbing behind your thigh or at your knee.  Pull until you feel a gentle stretch in your low back. Hold __________ seconds.  Slowly release your grasp and repeat the exercise with the opposite side. Repeat __________ times. Complete this exercise __________ times per day.  STRETCH - Flexion, Double Knee to Chest  Lie on a firm bed or floor with both legs extended in front of you.  Keeping one leg in contact with the floor, bring your opposite knee to your chest.  Tense your stomach muscles to support your back and then lift your other knee to your chest. Hold your legs in place by either grabbing behind your thighs or at your knees.  Pull both knees toward your chest until you feel a gentle stretch in your low back. Hold __________ seconds.  Tense your stomach muscles and slowly return one leg at a time to the floor. Repeat __________ times. Complete this exercise __________ times per day.  STRETCH - Low Trunk  Rotation   Lie on a firm bed or floor. Keeping your legs in front of you, bend your knees so they are both pointed toward the ceiling and your feet are flat on the floor.  Extend your arms out to the side. This will stabilize your upper body by keeping your shoulders in contact with the floor.  Gently and slowly drop both knees together to one side until you feel a gentle stretch in your low back. Hold for __________ seconds.  Tense your stomach muscles to support your low back as you bring your knees back to the starting position. Repeat the exercise to the other side. Repeat __________ times. Complete this exercise __________ times per day  EXTENSION RANGE OF MOTION AND FLEXIBILITY EXERCISES: STRETCH - Extension, Prone on Elbows  Lie on your stomach on the floor, a bed will be too soft. Place your palms about shoulder width apart and at the height of your  head.  Place your elbows under your shoulders. If this is too painful, stack pillows under your chest.  Allow your body to relax so that your hips drop lower and make contact more completely with the floor.  Hold this position for __________ seconds.  Slowly return to lying flat on the floor. Repeat __________ times. Complete this exercise __________ times per day.  RANGE OF MOTION - Extension, Prone Press Ups  Lie on your stomach on the floor, a bed will be too soft. Place your palms about shoulder width apart and at the height of your head.  Keeping your back as relaxed as possible, slowly straighten your elbows while keeping your hips on the floor. You may adjust the placement of your hands to maximize your comfort. As you gain motion, your hands will come more underneath your shoulders.  Hold this position __________ seconds.  Slowly return to lying flat on the floor. Repeat __________ times. Complete this exercise __________ times per day.  STRENGTHENING EXERCISES - Sciatica  These exercises may help you when beginning to rehabilitate your injury. These exercises should be done near your "sweet spot." This is the neutral, low-back arch, somewhere between fully rounded and fully arched, that is your least painful position. When performed in this safe range of motion, these exercises can be used for people who have either a flexion or extension based injury. These exercises may resolve your symptoms with or without further involvement from your physician, physical therapist or athletic trainer. While completing these exercises, remember:   Muscles can gain both the endurance and the strength needed for everyday activities through controlled exercises.  Complete these exercises as instructed by your physician, physical therapist or athletic trainer. Progress with the resistance and repetition exercises only as your caregiver advises.  You may experience muscle soreness or fatigue, but  the pain or discomfort you are trying to eliminate should never worsen during these exercises. If this pain does worsen, stop and make certain you are following the directions exactly. If the pain is still present after adjustments, discontinue the exercise until you can discuss the trouble with your clinician. STRENGTHENING - Deep Abdominals, Pelvic Tilt   Lie on a firm bed or floor. Keeping your legs in front of you, bend your knees so they are both pointed toward the ceiling and your feet are flat on the floor.  Tense your lower abdominal muscles to press your low back into the floor. This motion will rotate your pelvis so that your tail bone is scooping upwards rather than  pointing at your feet or into the floor.  With a gentle tension and even breathing, hold this position for __________ seconds. Repeat __________ times. Complete this exercise __________ times per day.  STRENGTHENING - Abdominals, Crunches   Lie on a firm bed or floor. Keeping your legs in front of you, bend your knees so they are both pointed toward the ceiling and your feet are flat on the floor. Cross your arms over your chest.  Slightly tip your chin down without bending your neck.  Tense your abdominals and slowly lift your trunk high enough to just clear your shoulder blades. Lifting higher can put excessive stress on the low back and does not further strengthen your abdominal muscles.  Control your return to the starting position. Repeat __________ times. Complete this exercise __________ times per day.  STRENGTHENING - Quadruped, Opposite UE/LE Lift  Assume a hands and knees position on a firm surface. Keep your hands under your shoulders and your knees under your hips. You may place padding under your knees for comfort.  Find your neutral spine and gently tense your abdominal muscles so that you can maintain this position. Your shoulders and hips should form a rectangle that is parallel with the floor and is not  twisted.  Keeping your trunk steady, lift your right hand no higher than your shoulder and then your left leg no higher than your hip. Make sure you are not holding your breath. Hold this position __________ seconds.  Continuing to keep your abdominal muscles tense and your back steady, slowly return to your starting position. Repeat with the opposite arm and leg. Repeat __________ times. Complete this exercise __________ times per day.  STRENGTHENING - Abdominals and Quadriceps, Straight Leg Raise   Lie on a firm bed or floor with both legs extended in front of you.  Keeping one leg in contact with the floor, bend the other knee so that your foot can rest flat on the floor.  Find your neutral spine, and tense your abdominal muscles to maintain your spinal position throughout the exercise.  Slowly lift your straight leg off the floor about 6 inches for a count of 15, making sure to not hold your breath.  Still keeping your neutral spine, slowly lower your leg all the way to the floor. Repeat this exercise with each leg __________ times. Complete this exercise __________ times per day. POSTURE AND BODY MECHANICS CONSIDERATIONS - Sciatica Keeping correct posture when sitting, standing or completing your activities will reduce the stress put on different body tissues, allowing injured tissues a chance to heal and limiting painful experiences. The following are general guidelines for improved posture. Your physician or physical therapist will provide you with any instructions specific to your needs. While reading these guidelines, remember:  The exercises prescribed by your provider will help you have the flexibility and strength to maintain correct postures.  The correct posture provides the optimal environment for your joints to work. All of your joints have less wear and tear when properly supported by a spine with good posture. This means you will experience a healthier, less painful  body.  Correct posture must be practiced with all of your activities, especially prolonged sitting and standing. Correct posture is as important when doing repetitive low-stress activities (typing) as it is when doing a single heavy-load activity (lifting). RESTING POSITIONS Consider which positions are most painful for you when choosing a resting position. If you have pain with flexion-based activities (sitting, bending, stooping, squatting),  choose a position that allows you to rest in a less flexed posture. You would want to avoid curling into a fetal position on your side. If your pain worsens with extension-based activities (prolonged standing, working overhead), avoid resting in an extended position such as sleeping on your stomach. Most people will find more comfort when they rest with their spine in a more neutral position, neither too rounded nor too arched. Lying on a non-sagging bed on your side with a pillow between your knees, or on your back with a pillow under your knees will often provide some relief. Keep in mind, being in any one position for a prolonged period of time, no matter how correct your posture, can still lead to stiffness. PROPER SITTING POSTURE In order to minimize stress and discomfort on your spine, you must sit with correct posture Sitting with good posture should be effortless for a healthy body. Returning to good posture is a gradual process. Many people can work toward this most comfortably by using various supports until they have the flexibility and strength to maintain this posture on their own. When sitting with proper posture, your ears will fall over your shoulders and your shoulders will fall over your hips. You should use the back of the chair to support your upper back. Your low back will be in a neutral position, just slightly arched. You may place a small pillow or folded towel at the base of your low back for support.  When working at a desk, create an  environment that supports good, upright posture. Without extra support, muscles fatigue and lead to excessive strain on joints and other tissues. Keep these recommendations in mind: CHAIR:   A chair should be able to slide under your desk when your back makes contact with the back of the chair. This allows you to work closely.  The chair's height should allow your eyes to be level with the upper part of your monitor and your hands to be slightly lower than your elbows. BODY POSITION  Your feet should make contact with the floor. If this is not possible, use a foot rest.  Keep your ears over your shoulders. This will reduce stress on your neck and low back. INCORRECT SITTING POSTURES   If you are feeling tired and unable to assume a healthy sitting posture, do not slouch or slump. This puts excessive strain on your back tissues, causing more damage and pain. Healthier options include:  Using more support, like a lumbar pillow.  Switching tasks to something that requires you to be upright or walking.  Talking a brief walk.  Lying down to rest in a neutral-spine position. PROLONGED STANDING WHILE SLIGHTLY LEANING FORWARD  When completing a task that requires you to lean forward while standing in one place for a long time, place either foot up on a stationary 2-4 inch high object to help maintain the best posture. When both feet are on the ground, the low back tends to lose its slight inward curve. If this curve flattens (or becomes too large), then the back and your other joints will experience too much stress, fatigue more quickly and can cause pain.  CORRECT STANDING POSTURES Proper standing posture should be assumed with all daily activities, even if they only take a few moments, like when brushing your teeth. As in sitting, your ears should fall over your shoulders and your shoulders should fall over your hips. You should keep a slight tension in your abdominal muscles to  brace your spine.  Your tailbone should point down to the ground, not behind your body, resulting in an over-extended swayback posture.  INCORRECT STANDING POSTURES  Common incorrect standing postures include a forward head, locked knees and/or an excessive swayback. WALKING Walk with an upright posture. Your ears, shoulders and hips should all line-up. PROLONGED ACTIVITY IN A FLEXED POSITION When completing a task that requires you to bend forward at your waist or lean over a low surface, try to find a way to stabilize 3 of 4 of your limbs. You can place a hand or elbow on your thigh or rest a knee on the surface you are reaching across. This will provide you more stability so that your muscles do not fatigue as quickly. By keeping your knees relaxed, or slightly bent, you will also reduce stress across your low back. CORRECT LIFTING TECHNIQUES DO :   Assume a wide stance. This will provide you more stability and the opportunity to get as close as possible to the object which you are lifting.  Tense your abdominals to brace your spine; then bend at the knees and hips. Keeping your back locked in a neutral-spine position, lift using your leg muscles. Lift with your legs, keeping your back straight.  Test the weight of unknown objects before attempting to lift them.  Try to keep your elbows locked down at your sides in order get the best strength from your shoulders when carrying an object.  Always ask for help when lifting heavy or awkward objects. INCORRECT LIFTING TECHNIQUES DO NOT:   Lock your knees when lifting, even if it is a small object.  Bend and twist. Pivot at your feet or move your feet when needing to change directions.  Assume that you cannot safely pick up a paperclip without proper posture.   This information is not intended to replace advice given to you by your health care provider. Make sure you discuss any questions you have with your health care provider.   Document Released:  07/12/2005 Document Revised: 11/26/2014 Document Reviewed: 10/24/2008 Elsevier Interactive Patient Education [employer]

## 2016-03-05 ENCOUNTER — Ambulatory Visit (INDEPENDENT_AMBULATORY_CARE_PROVIDER_SITE_OTHER): Payer: Medicaid Other | Admitting: Advanced Practice Midwife

## 2016-03-05 ENCOUNTER — Encounter: Payer: Self-pay | Admitting: Advanced Practice Midwife

## 2016-03-05 VITALS — BP 118/62 | HR 66 | Wt 201.0 lb

## 2016-03-05 DIAGNOSIS — Z3493 Encounter for supervision of normal pregnancy, unspecified, third trimester: Secondary | ICD-10-CM

## 2016-03-05 DIAGNOSIS — Z331 Pregnant state, incidental: Secondary | ICD-10-CM

## 2016-03-05 DIAGNOSIS — Z3483 Encounter for supervision of other normal pregnancy, third trimester: Secondary | ICD-10-CM

## 2016-03-05 DIAGNOSIS — Z3A33 33 weeks gestation of pregnancy: Secondary | ICD-10-CM

## 2016-03-05 DIAGNOSIS — Z1389 Encounter for screening for other disorder: Secondary | ICD-10-CM

## 2016-03-05 LAB — POCT URINALYSIS DIPSTICK
Blood, UA: NEGATIVE
GLUCOSE UA: NEGATIVE
KETONES UA: NEGATIVE
LEUKOCYTES UA: NEGATIVE
Nitrite, UA: NEGATIVE
PROTEIN UA: NEGATIVE

## 2016-03-05 NOTE — Patient Instructions (Signed)

## 2016-03-05 NOTE — Progress Notes (Signed)
??? ?? ????? ?? ?? ? ?? ??  ??? ???   6???? ?? ????? ??? ?????. 6??? ???? ??? ?? ? ?? ?? ??? ??????

## 2016-03-05 NOTE — Progress Notes (Signed)
[id number] 7846w5d Estimated Date of Delivery: 04/25/16  Blood pressure 118/62, pulse 66, weight 201 lb (91.2 kg), last menstrual period 07/20/2015, unknown if currently breastfeeding.   BP weight and urine results all reviewed and noted.  Please refer to the obstetrical flow sheet for the fundal height and fetal heart rate documentation:  Patient reports good fetal movement, denies any bleeding and no rupture of membranes symptoms or regular contractions. Patient is without complaints. All questions were answered.  Orders Placed This Encounter  Procedures  . POCT Urinalysis Dipstick    Plan:  Continued routine obstetrical care, Nexplanon ordered toay  Return in about 2 weeks (around 03/19/2016) for LROB.

## 2016-03-18 ENCOUNTER — Ambulatory Visit (INDEPENDENT_AMBULATORY_CARE_PROVIDER_SITE_OTHER): Payer: Medicaid Other | Admitting: Obstetrics & Gynecology

## 2016-03-18 ENCOUNTER — Encounter: Payer: Self-pay | Admitting: Obstetrics & Gynecology

## 2016-03-18 VITALS — BP 110/70 | HR 84 | Wt 204.0 lb

## 2016-03-18 DIAGNOSIS — Z1389 Encounter for screening for other disorder: Secondary | ICD-10-CM

## 2016-03-18 DIAGNOSIS — Z3483 Encounter for supervision of other normal pregnancy, third trimester: Secondary | ICD-10-CM

## 2016-03-18 DIAGNOSIS — Z3A35 35 weeks gestation of pregnancy: Secondary | ICD-10-CM

## 2016-03-18 DIAGNOSIS — Z3493 Encounter for supervision of normal pregnancy, unspecified, third trimester: Secondary | ICD-10-CM

## 2016-03-18 DIAGNOSIS — O34219 Maternal care for unspecified type scar from previous cesarean delivery: Secondary | ICD-10-CM

## 2016-03-18 DIAGNOSIS — Z331 Pregnant state, incidental: Secondary | ICD-10-CM

## 2016-03-18 LAB — POCT URINALYSIS DIPSTICK
Blood, UA: NEGATIVE
Glucose, UA: NEGATIVE
Ketones, UA: NEGATIVE
LEUKOCYTES UA: NEGATIVE
NITRITE UA: NEGATIVE
PROTEIN UA: NEGATIVE

## 2016-03-18 NOTE — Progress Notes (Signed)
[id number] 3240w4d Estimated Date of Delivery: 04/25/16  Blood pressure 110/70, pulse 84, weight 204 lb (92.5 kg), last menstrual period 07/20/2015, unknown if currently breastfeeding.   BP weight and urine results all reviewed and noted.  Please refer to the obstetrical flow sheet for the fundal height and fetal heart rate documentation:  Patient reports good fetal movement, denies any bleeding and no rupture of membranes symptoms or regular contractions. Patient is without complaints. All questions were answered.  Orders Placed This Encounter  Procedures  . POCT urinalysis dipstick    Plan:  Continued routine obstetrical care, will scheduled repeat section 04/19/2016  Return in about 2 weeks (around 04/01/2016) for LROB, with Dr Despina HiddenEure.

## 2016-04-02 ENCOUNTER — Ambulatory Visit (INDEPENDENT_AMBULATORY_CARE_PROVIDER_SITE_OTHER): Payer: Medicaid Other | Admitting: Obstetrics & Gynecology

## 2016-04-02 ENCOUNTER — Encounter: Payer: Self-pay | Admitting: Obstetrics & Gynecology

## 2016-04-02 VITALS — BP 128/70 | HR 80 | Wt 206.0 lb

## 2016-04-02 DIAGNOSIS — Z1159 Encounter for screening for other viral diseases: Secondary | ICD-10-CM

## 2016-04-02 DIAGNOSIS — Z1389 Encounter for screening for other disorder: Secondary | ICD-10-CM

## 2016-04-02 DIAGNOSIS — Z331 Pregnant state, incidental: Secondary | ICD-10-CM

## 2016-04-02 DIAGNOSIS — Z3493 Encounter for supervision of normal pregnancy, unspecified, third trimester: Secondary | ICD-10-CM

## 2016-04-02 DIAGNOSIS — Z3685 Encounter for antenatal screening for Streptococcus B: Secondary | ICD-10-CM

## 2016-04-02 DIAGNOSIS — Z3A37 37 weeks gestation of pregnancy: Secondary | ICD-10-CM

## 2016-04-02 DIAGNOSIS — Z3483 Encounter for supervision of other normal pregnancy, third trimester: Secondary | ICD-10-CM

## 2016-04-02 DIAGNOSIS — Z118 Encounter for screening for other infectious and parasitic diseases: Secondary | ICD-10-CM

## 2016-04-02 LAB — POCT URINALYSIS DIPSTICK
Blood, UA: NEGATIVE
GLUCOSE UA: NEGATIVE
Ketones, UA: NEGATIVE
Leukocytes, UA: NEGATIVE
NITRITE UA: NEGATIVE
PROTEIN UA: NEGATIVE

## 2016-04-02 NOTE — Progress Notes (Signed)
[id number] 5860w5d Estimated Date of Delivery: 04/25/16  Blood pressure 128/70, pulse 80, weight 206 lb (93.4 kg), last menstrual period 07/20/2015, unknown if currently breastfeeding.   BP weight and urine results all reviewed and noted.  Please refer to the obstetrical flow sheet for the fundal height and fetal heart rate documentation:  Patient reports good fetal movement, denies any bleeding and no rupture of membranes symptoms or regular contractions. Patient is without complaints. All questions were answered.  Orders Placed This Encounter  Procedures  . GC/Chlamydia Probe Amp  . Strep Gp B NAA  . POCT urinalysis dipstick    Plan:  Continued routine obstetrical care, repeat section 9/25 No Follow-up on file.

## 2016-04-04 LAB — STREP GP B NAA: Strep Gp B NAA: NEGATIVE

## 2016-04-04 LAB — GC/CHLAMYDIA PROBE AMP
CHLAMYDIA, DNA PROBE: NEGATIVE
NEISSERIA GONORRHOEAE BY PCR: NEGATIVE

## 2016-04-09 ENCOUNTER — Ambulatory Visit (INDEPENDENT_AMBULATORY_CARE_PROVIDER_SITE_OTHER): Payer: Medicaid Other | Admitting: Obstetrics & Gynecology

## 2016-04-09 VITALS — BP 120/78 | HR 68 | Wt 207.5 lb

## 2016-04-09 DIAGNOSIS — Z3A38 38 weeks gestation of pregnancy: Secondary | ICD-10-CM

## 2016-04-09 DIAGNOSIS — Z331 Pregnant state, incidental: Secondary | ICD-10-CM

## 2016-04-09 DIAGNOSIS — Z1389 Encounter for screening for other disorder: Secondary | ICD-10-CM

## 2016-04-09 DIAGNOSIS — Z3493 Encounter for supervision of normal pregnancy, unspecified, third trimester: Secondary | ICD-10-CM

## 2016-04-09 DIAGNOSIS — O34219 Maternal care for unspecified type scar from previous cesarean delivery: Secondary | ICD-10-CM

## 2016-04-09 LAB — POCT URINALYSIS DIPSTICK
Blood, UA: NEGATIVE
Glucose, UA: NEGATIVE
Ketones, UA: NEGATIVE
LEUKOCYTES UA: NEGATIVE
NITRITE UA: NEGATIVE
PROTEIN UA: NEGATIVE

## 2016-04-09 NOTE — Progress Notes (Signed)
[id number] 7463w5d Estimated Date of Delivery: 04/25/16  Blood pressure 120/78, pulse 68, weight 207 lb 8 oz (94.1 kg), last menstrual period 07/20/2015, unknown if currently breastfeeding.   BP weight and urine results all reviewed and noted.  Please refer to the obstetrical flow sheet for the fundal height and fetal heart rate documentation:  Patient reports good fetal movement, denies any bleeding and no rupture of membranes symptoms or regular contractions. Patient is without complaints. All questions were answered.  Orders Placed This Encounter  Procedures  . POCT urinalysis dipstick    Plan:  Continued routine obstetrical care, repeat C section + BTL 9/25  Return in about 3 weeks (around 04/27/2016) for Post Op, with Dr Despina HiddenEure.

## 2016-04-13 ENCOUNTER — Encounter (HOSPITAL_COMMUNITY): Payer: Self-pay

## 2016-04-16 ENCOUNTER — Encounter (HOSPITAL_COMMUNITY)
Admission: RE | Admit: 2016-04-16 | Discharge: 2016-04-16 | Disposition: A | Discharge status: Home / Self Care | Payer: Medicaid Other | Source: Ambulatory Visit | Attending: Obstetrics & Gynecology | Admitting: Obstetrics & Gynecology

## 2016-04-16 LAB — CBC
HCT: 35.6 % — ABNORMAL LOW (ref 36.0–46.0)
Hemoglobin: 12.5 g/dL (ref 12.0–15.0)
MCH: 30.6 pg (ref 26.0–34.0)
MCHC: 35.1 g/dL (ref 30.0–36.0)
MCV: 87.3 fL (ref 78.0–100.0)
PLATELETS: 183 10*3/uL (ref 150–400)
RBC: 4.08 MIL/uL (ref 3.87–5.11)
RDW: 12.6 % (ref 11.5–15.5)
WBC: 10.2 10*3/uL (ref 4.0–10.5)

## 2016-04-16 LAB — TYPE AND SCREEN
ABO/RH(D): A POS
Antibody Screen: NEGATIVE

## 2016-04-16 LAB — ABO/RH: ABO/RH(D): A POS

## 2016-04-16 NOTE — Patient Instructions (Signed)
20 Shara Blazingeka J Klang  04/16/2016   Your procedure is scheduled on:  04/19/2016  [facility] through the [facility] Entrance of [facility] at 0600 AM.  Pick up the phone at the desk and dial [phone].   Call this number if you have problems the morning of surgery: [phone]   Remember:   Do not eat food:After Midnight.  Do not drink clear liquids: After Midnight.  Take these medicines the morning of surgery with A SIP OF WATER: none   Do not wear jewelry, make-up or nail polish.  Do not wear lotions, powders, or perfumes. Do not wear deodorant.  Do not shave 48 hours prior to surgery.  Do not bring valuables to the hospital.  [facility] is not   responsible for any belongings or valuables brought to the hospital.  Contacts, dentures or bridgework may not be worn into surgery.  Leave suitcase in the car. After surgery it may be brought to your room.  For patients admitted to the hospital, checkout time is 11:00 AM the day of              discharge.   Patients discharged the day of surgery will not be allowed to drive             home.  Name and phone number of your driver: na  Special Instructions:   N/A   Please read over the following fact sheets that you were given:   Surgical Site Infection Prevention

## 2016-04-17 LAB — RPR: RPR: NONREACTIVE

## 2016-04-19 ENCOUNTER — Other Ambulatory Visit: Payer: Self-pay | Admitting: Obstetrics & Gynecology

## 2016-04-19 ENCOUNTER — Inpatient Hospital Stay (HOSPITAL_COMMUNITY): Payer: Medicaid Other | Admitting: Anesthesiology

## 2016-04-19 ENCOUNTER — Inpatient Hospital Stay (HOSPITAL_COMMUNITY)
Admission: RE | Admit: 2016-04-19 | Discharge: 2016-04-22 | DRG: 766 | Disposition: A | Discharge status: Home / Self Care | Payer: Medicaid Other | Source: Ambulatory Visit | Attending: Family Medicine | Admitting: Family Medicine

## 2016-04-19 ENCOUNTER — Encounter (HOSPITAL_COMMUNITY)
Admission: RE | Disposition: A | Discharge status: Home / Self Care | Payer: Self-pay | Source: Ambulatory Visit | Attending: Family Medicine

## 2016-04-19 ENCOUNTER — Encounter (HOSPITAL_COMMUNITY): Payer: Self-pay

## 2016-04-19 DIAGNOSIS — Z3A39 39 weeks gestation of pregnancy: Secondary | ICD-10-CM | POA: Diagnosis not present

## 2016-04-19 DIAGNOSIS — Z302 Encounter for sterilization: Secondary | ICD-10-CM | POA: Diagnosis not present

## 2016-04-19 DIAGNOSIS — O34211 Maternal care for low transverse scar from previous cesarean delivery: Secondary | ICD-10-CM | POA: Diagnosis not present

## 2016-04-19 DIAGNOSIS — Z87891 Personal history of nicotine dependence: Secondary | ICD-10-CM | POA: Diagnosis not present

## 2016-04-19 DIAGNOSIS — Z3490 Encounter for supervision of normal pregnancy, unspecified, unspecified trimester: Secondary | ICD-10-CM

## 2016-04-19 LAB — COMPREHENSIVE METABOLIC PANEL
ALBUMIN: 2.6 g/dL — AB (ref 3.5–5.0)
ALT: 11 U/L — AB (ref 14–54)
AST: 17 U/L (ref 15–41)
Alkaline Phosphatase: 155 U/L — ABNORMAL HIGH (ref 38–126)
Anion gap: 5 (ref 5–15)
BUN: 7 mg/dL (ref 6–20)
CHLORIDE: 104 mmol/L (ref 101–111)
CO2: 26 mmol/L (ref 22–32)
CREATININE: 0.53 mg/dL (ref 0.44–1.00)
Calcium: 8.5 mg/dL — ABNORMAL LOW (ref 8.9–10.3)
GFR calc Af Amer: >60 mL/min (ref >60)
GLUCOSE: 92 mg/dL (ref 65–99)
POTASSIUM: 3.8 mmol/L (ref 3.5–5.1)
Sodium: 135 mmol/L (ref 135–145)
Total Bilirubin: 0.2 mg/dL — ABNORMAL LOW (ref 0.3–1.2)
Total Protein: 5.8 g/dL — ABNORMAL LOW (ref 6.5–8.1)

## 2016-04-19 LAB — CBC
HEMATOCRIT: 32.5 % — AB (ref 36.0–46.0)
Hemoglobin: 11.3 g/dL — ABNORMAL LOW (ref 12.0–15.0)
MCH: 30.5 pg (ref 26.0–34.0)
MCHC: 34.8 g/dL (ref 30.0–36.0)
MCV: 87.8 fL (ref 78.0–100.0)
PLATELETS: 157 10*3/uL (ref 150–400)
RBC: 3.7 MIL/uL — AB (ref 3.87–5.11)
RDW: 12.7 % (ref 11.5–15.5)
WBC: 12.3 10*3/uL — AB (ref 4.0–10.5)

## 2016-04-19 SURGERY — Surgical Case
Anesthesia: Epidural

## 2016-04-19 MED ORDER — NALBUPHINE HCL 10 MG/ML IJ SOLN: 5.0000 mg | Freq: Once | INTRAMUSCULAR | Status: DC | PRN

## 2016-04-19 MED ORDER — SIMETHICONE 80 MG PO CHEW: 80.0000 mg | CHEWABLE_TABLET | ORAL | Status: DC | PRN

## 2016-04-19 MED ORDER — SCOPOLAMINE 1 MG/3DAYS TD PT72
MEDICATED_PATCH | TRANSDERMAL | Status: AC
  Administered 2016-04-19: 1.5 mg via TRANSDERMAL
  Filled 2016-04-19: qty 1

## 2016-04-19 MED ORDER — SCOPOLAMINE 1 MG/3DAYS TD PT72
1.0000 | MEDICATED_PATCH | Freq: Once | TRANSDERMAL | Status: DC
  Administered 2016-04-19: 1.5 mg via TRANSDERMAL

## 2016-04-19 MED ORDER — MEPERIDINE HCL 25 MG/ML IJ SOLN: 6.2500 mg | INTRAMUSCULAR | Status: DC | PRN

## 2016-04-19 MED ORDER — ONDANSETRON HCL 4 MG/2ML IJ SOLN
INTRAMUSCULAR | Status: DC | PRN
  Administered 2016-04-19: 4 mg via INTRAVENOUS

## 2016-04-19 MED ORDER — NALBUPHINE HCL 10 MG/ML IJ SOLN: 5.0000 mg | INTRAMUSCULAR | Status: DC | PRN

## 2016-04-19 MED ORDER — LACTATED RINGERS IV SOLN
Freq: Once | INTRAVENOUS | Status: AC
  Administered 2016-04-19: 07:00:00 via INTRAVENOUS

## 2016-04-19 MED ORDER — OXYTOCIN 40 UNITS IN LACTATED RINGERS INFUSION - SIMPLE MED: 2.5000 [IU]/h | INTRAVENOUS | Status: AC

## 2016-04-19 MED ORDER — OXYTOCIN 10 UNIT/ML IJ SOLN
INTRAVENOUS | Status: DC | PRN
  Administered 2016-04-19: 40 [IU] via INTRAVENOUS

## 2016-04-19 MED ORDER — SIMETHICONE 80 MG PO CHEW
80.0000 mg | CHEWABLE_TABLET | ORAL | Status: DC
  Administered 2016-04-20 – 2016-04-21 (×3): 80 mg via ORAL
  Filled 2016-04-19 (×3): qty 1

## 2016-04-19 MED ORDER — PRENATAL 27-0.8 MG PO TABS
1.0000 | ORAL_TABLET | Freq: Every day | ORAL | Status: DC
  Filled 2016-04-19: qty 1

## 2016-04-19 MED ORDER — TETANUS-DIPHTH-ACELL PERTUSSIS 5-2.5-18.5 LF-MCG/0.5 IM SUSP
0.5000 mL | Freq: Once | INTRAMUSCULAR | Status: AC
  Administered 2016-04-20: 0.5 mL via INTRAMUSCULAR
  Filled 2016-04-19: qty 0.5

## 2016-04-19 MED ORDER — NALOXONE HCL 0.4 MG/ML IJ SOLN: 0.4000 mg | INTRAMUSCULAR | Status: DC | PRN

## 2016-04-19 MED ORDER — DEXAMETHASONE SODIUM PHOSPHATE 10 MG/ML IJ SOLN
INTRAMUSCULAR | Status: DC | PRN
  Administered 2016-04-19: 10 mg via INTRAVENOUS

## 2016-04-19 MED ORDER — MORPHINE SULFATE (PF) 0.5 MG/ML IJ SOLN
INTRAMUSCULAR | Status: DC | PRN
  Administered 2016-04-19: .1 mg via EPIDURAL
  Administered 2016-04-19: .4 mg via INTRAVENOUS

## 2016-04-19 MED ORDER — HYDROMORPHONE HCL 1 MG/ML IJ SOLN: 0.2500 mg | INTRAMUSCULAR | Status: DC | PRN

## 2016-04-19 MED ORDER — METOCLOPRAMIDE HCL 5 MG/ML IJ SOLN
INTRAMUSCULAR | Status: DC | PRN
  Administered 2016-04-19: 10 mg via INTRAVENOUS

## 2016-04-19 MED ORDER — PROMETHAZINE HCL 25 MG/ML IJ SOLN
INTRAMUSCULAR | Status: DC | PRN
  Administered 2016-04-19: 5 mg via INTRAVENOUS

## 2016-04-19 MED ORDER — ONDANSETRON HCL 4 MG/2ML IJ SOLN: 4.0000 mg | Freq: Three times a day (TID) | INTRAMUSCULAR | Status: DC | PRN

## 2016-04-19 MED ORDER — SENNOSIDES-DOCUSATE SODIUM 8.6-50 MG PO TABS
2.0000 | ORAL_TABLET | ORAL | Status: DC
  Administered 2016-04-20 – 2016-04-21 (×3): 2 via ORAL
  Filled 2016-04-19 (×3): qty 2

## 2016-04-19 MED ORDER — FENTANYL CITRATE (PF) 100 MCG/2ML IJ SOLN
INTRAMUSCULAR | Status: DC | PRN
  Administered 2016-04-19: 100 ug via INTRAVENOUS

## 2016-04-19 MED ORDER — IBUPROFEN 600 MG PO TABS
600.0000 mg | ORAL_TABLET | Freq: Four times a day (QID) | ORAL | Status: DC
  Administered 2016-04-19 – 2016-04-22 (×12): 600 mg via ORAL
  Filled 2016-04-19 (×12): qty 1

## 2016-04-19 MED ORDER — PROMETHAZINE HCL 25 MG/ML IJ SOLN
INTRAMUSCULAR | Status: AC
  Filled 2016-04-19: qty 1

## 2016-04-19 MED ORDER — OXYTOCIN 10 UNIT/ML IJ SOLN
INTRAMUSCULAR | Status: AC
  Filled 2016-04-19: qty 4

## 2016-04-19 MED ORDER — ACETAMINOPHEN 500 MG PO TABS
1000.0000 mg | ORAL_TABLET | Freq: Four times a day (QID) | ORAL | Status: DC
  Administered 2016-04-19: 1000 mg via ORAL
  Filled 2016-04-19: qty 2

## 2016-04-19 MED ORDER — SIMETHICONE 80 MG PO CHEW
80.0000 mg | CHEWABLE_TABLET | Freq: Three times a day (TID) | ORAL | Status: DC
  Administered 2016-04-19 – 2016-04-22 (×8): 80 mg via ORAL
  Filled 2016-04-19 (×8): qty 1

## 2016-04-19 MED ORDER — PHENYLEPHRINE 8 MG IN D5W 100 ML (0.08MG/ML) PREMIX OPTIME
INJECTION | INTRAVENOUS | Status: DC | PRN
  Administered 2016-04-19: 60 ug/min via INTRAVENOUS

## 2016-04-19 MED ORDER — ACETAMINOPHEN 500 MG PO TABS: 500.0000 mg | ORAL_TABLET | Freq: Four times a day (QID) | ORAL | Status: DC | PRN

## 2016-04-19 MED ORDER — BUPIVACAINE IN DEXTROSE 0.75-8.25 % IT SOLN
INTRATHECAL | Status: AC
  Filled 2016-04-19: qty 2

## 2016-04-19 MED ORDER — DIPHENHYDRAMINE HCL 25 MG PO CAPS: 25.0000 mg | ORAL_CAPSULE | Freq: Four times a day (QID) | ORAL | Status: DC | PRN

## 2016-04-19 MED ORDER — LACTATED RINGERS IV SOLN
INTRAVENOUS | Status: DC
  Administered 2016-04-19 (×3): via INTRAVENOUS

## 2016-04-19 MED ORDER — LACTATED RINGERS IV SOLN
INTRAVENOUS | Status: DC
  Administered 2016-04-19: 14:00:00 via INTRAVENOUS

## 2016-04-19 MED ORDER — METOCLOPRAMIDE HCL 5 MG/ML IJ SOLN
INTRAMUSCULAR | Status: AC
  Filled 2016-04-19: qty 2

## 2016-04-19 MED ORDER — COCONUT OIL OIL: 1.0000 "application " | TOPICAL_OIL | Status: DC | PRN

## 2016-04-19 MED ORDER — INFLUENZA VAC SPLIT QUAD 0.5 ML IM SUSY: 0.5000 mL | PREFILLED_SYRINGE | INTRAMUSCULAR | Status: DC

## 2016-04-19 MED ORDER — DEXAMETHASONE SODIUM PHOSPHATE 10 MG/ML IJ SOLN
INTRAMUSCULAR | Status: AC
  Filled 2016-04-19: qty 1

## 2016-04-19 MED ORDER — NALOXONE HCL 2 MG/2ML IJ SOSY
1.0000 ug/kg/h | PREFILLED_SYRINGE | INTRAVENOUS | Status: DC | PRN
  Filled 2016-04-19: qty 2

## 2016-04-19 MED ORDER — DIPHENHYDRAMINE HCL 50 MG/ML IJ SOLN: 12.5000 mg | INTRAMUSCULAR | Status: DC | PRN

## 2016-04-19 MED ORDER — FENTANYL CITRATE (PF) 100 MCG/2ML IJ SOLN
INTRAMUSCULAR | Status: AC
  Filled 2016-04-19: qty 2

## 2016-04-19 MED ORDER — PRENATAL MULTIVITAMIN CH
1.0000 | ORAL_TABLET | Freq: Every day | ORAL | Status: DC
  Administered 2016-04-20 – 2016-04-21 (×2): 1 via ORAL
  Filled 2016-04-19 (×3): qty 1

## 2016-04-19 MED ORDER — SODIUM CHLORIDE 0.9 % IJ SOLN
INTRAMUSCULAR | Status: AC
  Filled 2016-04-19: qty 10

## 2016-04-19 MED ORDER — WITCH HAZEL-GLYCERIN EX PADS: 1.0000 "application " | MEDICATED_PAD | CUTANEOUS | Status: DC | PRN

## 2016-04-19 MED ORDER — MORPHINE SULFATE-NACL 0.5-0.9 MG/ML-% IV SOSY
PREFILLED_SYRINGE | INTRAVENOUS | Status: AC
  Filled 2016-04-19: qty 1

## 2016-04-19 MED ORDER — DIPHENHYDRAMINE HCL 25 MG PO CAPS: 25.0000 mg | ORAL_CAPSULE | ORAL | Status: DC | PRN

## 2016-04-19 MED ORDER — BUPIVACAINE IN DEXTROSE 0.75-8.25 % IT SOLN
INTRATHECAL | Status: DC | PRN
  Administered 2016-04-19: 1.4 mL via INTRATHECAL

## 2016-04-19 MED ORDER — SODIUM CHLORIDE 0.9% FLUSH: 3.0000 mL | INTRAVENOUS | Status: DC | PRN

## 2016-04-19 MED ORDER — OXYCODONE-ACETAMINOPHEN 5-325 MG PO TABS
2.0000 | ORAL_TABLET | ORAL | Status: DC | PRN
  Administered 2016-04-19 – 2016-04-22 (×11): 2 via ORAL
  Filled 2016-04-19 (×11): qty 2

## 2016-04-19 MED ORDER — CHLOROPROCAINE HCL (PF) 3 % IJ SOLN
INTRAMUSCULAR | Status: AC
  Filled 2016-04-19: qty 20

## 2016-04-19 MED ORDER — CHLOROPROCAINE HCL (PF) 3 % IJ SOLN
INTRAMUSCULAR | Status: DC | PRN
  Administered 2016-04-19: 20 mL

## 2016-04-19 MED ORDER — PHENYLEPHRINE 8 MG IN D5W 100 ML (0.08MG/ML) PREMIX OPTIME
INJECTION | INTRAVENOUS | Status: AC
  Filled 2016-04-19: qty 100

## 2016-04-19 MED ORDER — OXYCODONE-ACETAMINOPHEN 5-325 MG PO TABS
1.0000 | ORAL_TABLET | ORAL | Status: DC | PRN
  Administered 2016-04-20 – 2016-04-21 (×2): 1 via ORAL
  Filled 2016-04-19 (×3): qty 1

## 2016-04-19 MED ORDER — CEFAZOLIN SODIUM-DEXTROSE 2-4 GM/100ML-% IV SOLN
2.0000 g | INTRAVENOUS | Status: AC
  Administered 2016-04-19: 2 g via INTRAVENOUS

## 2016-04-19 MED ORDER — ONDANSETRON HCL 4 MG/2ML IJ SOLN
INTRAMUSCULAR | Status: AC
  Filled 2016-04-19: qty 2

## 2016-04-19 MED ORDER — MENTHOL 3 MG MT LOZG: 1.0000 | LOZENGE | OROMUCOSAL | Status: DC | PRN

## 2016-04-19 MED ORDER — LACTATED RINGERS IV SOLN
INTRAVENOUS | Status: DC | PRN
  Administered 2016-04-19: 08:00:00 via INTRAVENOUS

## 2016-04-19 MED ORDER — ZOLPIDEM TARTRATE 5 MG PO TABS: 5.0000 mg | ORAL_TABLET | Freq: Every evening | ORAL | Status: DC | PRN

## 2016-04-19 MED ORDER — DIBUCAINE 1 % RE OINT: 1.0000 "application " | TOPICAL_OINTMENT | RECTAL | Status: DC | PRN

## 2016-04-19 SURGICAL SUPPLY — 38 items
CHLORAPREP W/TINT 26ML (MISCELLANEOUS) ×6 IMPLANT
CLAMP CORD UMBIL (MISCELLANEOUS) IMPLANT
CLOTH BEACON ORANGE TIMEOUT ST (SAFETY) ×3 IMPLANT
DRSG OPSITE POSTOP 4X10 (GAUZE/BANDAGES/DRESSINGS) ×3 IMPLANT
ELECT REM PT RETURN 9FT ADLT (ELECTROSURGICAL) ×3
ELECTRODE REM PT RTRN 9FT ADLT (ELECTROSURGICAL) ×1 IMPLANT
EXTRACTOR VACUUM BELL STYLE (SUCTIONS) IMPLANT
GLOVE BIOGEL PI IND STRL 7.0 (GLOVE) ×1 IMPLANT
GLOVE BIOGEL PI IND STRL 8 (GLOVE) ×1 IMPLANT
GLOVE BIOGEL PI INDICATOR 7.0 (GLOVE) ×2
GLOVE BIOGEL PI INDICATOR 8 (GLOVE) ×2
GLOVE ECLIPSE 8.0 STRL XLNG CF (GLOVE) ×3 IMPLANT
GOWN STRL REUS W/TWL LRG LVL3 (GOWN DISPOSABLE) ×6 IMPLANT
KIT ABG SYR 3ML LUER SLIP (SYRINGE) ×3 IMPLANT
LIQUID BAND (GAUZE/BANDAGES/DRESSINGS) ×6 IMPLANT
NEEDLE HYPO 18GX1.5 BLUNT FILL (NEEDLE) ×3 IMPLANT
NEEDLE HYPO 22GX1.5 SAFETY (NEEDLE) ×3 IMPLANT
NEEDLE HYPO 25X5/8 SAFETYGLIDE (NEEDLE) ×3 IMPLANT
NS IRRIG 1000ML POUR BTL (IV SOLUTION) ×3 IMPLANT
PACK C SECTION WH (CUSTOM PROCEDURE TRAY) ×3 IMPLANT
PAD ABD 7.5X8 STRL (GAUZE/BANDAGES/DRESSINGS) ×3 IMPLANT
PAD OB MATERNITY 4.3X12.25 (PERSONAL CARE ITEMS) ×3 IMPLANT
PENCIL SMOKE EVAC W/HOLSTER (ELECTROSURGICAL) ×3 IMPLANT
RTRCTR C-SECT PINK 25CM LRG (MISCELLANEOUS) IMPLANT
SPONGE DRAIN TRACH 4X4 STRL 2S (GAUZE/BANDAGES/DRESSINGS) ×6 IMPLANT
SUT CHROMIC 0 CT 1 (SUTURE) ×3 IMPLANT
SUT MNCRL 0 VIOLET CTX 36 (SUTURE) ×2 IMPLANT
SUT MONOCRYL 0 CTX 36 (SUTURE) ×4
SUT PLAIN 2 0 (SUTURE)
SUT PLAIN 2 0 XLH (SUTURE) IMPLANT
SUT PLAIN ABS 2-0 CT1 27XMFL (SUTURE) IMPLANT
SUT VIC AB 0 CTX 36 (SUTURE) ×2
SUT VIC AB 0 CTX36XBRD ANBCTRL (SUTURE) ×1 IMPLANT
SUT VIC AB 4-0 KS 27 (SUTURE) IMPLANT
SYR 20CC LL (SYRINGE) ×6 IMPLANT
TAPE CLOTH 4X10 WHT NS (GAUZE/BANDAGES/DRESSINGS) ×3 IMPLANT
TOWEL OR 17X24 6PK STRL BLUE (TOWEL DISPOSABLE) ×3 IMPLANT
TRAY FOLEY CATH SILVER 14FR (SET/KITS/TRAYS/PACK) IMPLANT

## 2016-04-19 NOTE — Anesthesia Postprocedure Evaluation (Signed)
Anesthesia Post Note  Patient: Shara Blazingeka J Rambo  Procedure(s) Performed: Procedure(s) (LRB): REPEAT CESAREAN SECTION (N/A)  Patient location during evaluation: PACU Anesthesia Type: Spinal Level of consciousness: oriented and awake and alert Pain management: pain level controlled Vital Signs Assessment: post-procedure vital signs reviewed and stable Respiratory status: spontaneous breathing, respiratory function stable and nonlabored ventilation Cardiovascular status: blood pressure returned to baseline and stable Postop Assessment: no headache, no backache, spinal receding, no signs of nausea or vomiting and patient able to bend at knees Anesthetic complications: no     Last Vitals:  Vitals:   04/19/16 1000 04/19/16 1015  BP:  124/68  Pulse: (!) 52 (!) 58  Resp: 15 17  Temp: 36.6 C     Last Pain:  Vitals:   04/19/16 1000  TempSrc: Oral  PainSc:    Pain Goal: Patients Stated Pain Goal: 3 (04/19/16 [id number])               Jasma Seevers A.

## 2016-04-19 NOTE — Anesthesia Postprocedure Evaluation (Signed)
Anesthesia Post Note  Patient: Cathy Larson  Procedure(s) Performed: Procedure(s) (LRB): REPEAT CESAREAN SECTION (N/A)  Patient location during evaluation: Mother Baby Anesthesia Type: Spinal Level of consciousness: oriented and awake and alert Pain management: pain level controlled Vital Signs Assessment: post-procedure vital signs reviewed and stable Respiratory status: spontaneous breathing and respiratory function stable Cardiovascular status: blood pressure returned to baseline and stable Postop Assessment: no headache and no backache Anesthetic complications: no     Last Vitals:  Vitals:   04/19/16 1058 04/19/16 1202  BP: 122/64 (!) 120/57  Pulse: (!) 59 (!) 57  Resp: 16 18  Temp: 36.7 C 36.3 C    Last Pain:  Vitals:   04/19/16 1202  TempSrc: Axillary  PainSc:    Pain Goal: Patients Stated Pain Goal: 3 (04/19/16 [id number])               Junious SilkGILBERT,Gregorio Worley

## 2016-04-19 NOTE — Progress Notes (Signed)
Notified resident that some of the standard orders are missing, states she will check them later.

## 2016-04-19 NOTE — Progress Notes (Signed)
Notified resident patients routine postpartum orders need ordered.

## 2016-04-19 NOTE — Addendum Note (Signed)
Addendum  created 04/19/16 1339 by Junious SilkMelinda Sula Fetterly, CRNA   Sign clinical note

## 2016-04-19 NOTE — Anesthesia Preprocedure Evaluation (Signed)
Anesthesia Evaluation  Patient identified by MRN, date of birth, ID band Patient awake    Reviewed: Allergy & Precautions, H&P , Patient's Chart, lab work & pertinent test results  Airway Mallampati: II  TM Distance: >3 FB Neck ROM: full    Dental no notable dental hx.    Pulmonary former smoker,    Pulmonary exam normal breath sounds clear to auscultation       Cardiovascular Exercise Tolerance: Good  Rhythm:regular Rate:Normal     Neuro/Psych    GI/Hepatic   Endo/Other    Renal/GU      Musculoskeletal   Abdominal   Peds  Hematology   Anesthesia Other Findings   Reproductive/Obstetrics                             Anesthesia Physical Anesthesia Plan  ASA: III  Anesthesia Plan: Spinal, Epidural and Combined Spinal and Epidural   Post-op Pain Management:    Induction:   Airway Management Planned:   Additional Equipment:   Intra-op Plan:   Post-operative Plan:   Informed Consent: I have reviewed the patients History and Physical, chart, labs and discussed the procedure including the risks, benefits and alternatives for the proposed anesthesia with the patient or authorized representative who has indicated his/her understanding and acceptance.   Dental Advisory Given  Plan Discussed with: CRNA  Anesthesia Plan Comments: (Lab work confirmed with CRNA in room. Platelets okay. Discussed spinal anesthetic, and patient consents to the procedure:  included risk of possible headache,backache, failed block, allergic reaction, and nerve injury. This patient was asked if she had any questions or concerns before the procedure started. )        Anesthesia Quick Evaluation

## 2016-04-19 NOTE — H&P (Signed)
Preoperative History and Physical  Cathy Larson is a 25 y.o. [phone] with Patient's last menstrual period was 07/20/2015 (exact date). admitted for a repeat Caesarean section with bil;ateral tubal ligation.  Previous section x 3  PMH:    Past Medical History:  Diagnosis Date  . Depression   . History of trichomoniasis     PSH:     Past Surgical History:  Procedure Laterality Date  . CESAREAN SECTION    . HERNIA REPAIR     pt was 56 weeks old    POb/GynH:      OB History    Gravida Para Term Preterm AB Living   4 3 2 1   3    SAB TAB Ectopic Multiple Live Births           3      SH:   Social History  Substance Use Topics  . Smoking status: Former Smoker    Packs/day: 0.40    Types: Cigarettes  . Smokeless tobacco: Never Used  . Alcohol use No     Comment: occasionally    FH:    Family History  Problem Relation Age of Onset  . Thyroid disease Mother   . Cancer Maternal Grandmother     brain  . Cancer Maternal Uncle   . Cancer Other     lung  . Cancer Other     brain  . Alcohol abuse Maternal Grandfather   . Kidney failure Maternal Grandfather   . Cancer Maternal Aunt      Allergies: No Known Allergies  Medications:       Current Facility-Administered Medications:  .  lactated ringers infusion, , Intravenous, Once, Cristela Blue, MD .  lactated ringers infusion, , Intravenous, Continuous, Cristela Blue, MD .  scopolamine (TRANSDERM-SCOP) 1 MG/3DAYS 1.5 mg, 1 patch, Transdermal, Once, Cristela Blue, MD, 1.5 mg at 04/19/16 4540  Review of Systems:   Review of Systems  Constitutional: Negative for fever, chills, weight loss, malaise/fatigue and diaphoresis.  HENT: Negative for hearing loss, ear pain, nosebleeds, congestion, sore throat, neck pain, tinnitus and ear discharge.   Eyes: Negative for blurred vision, double vision, photophobia, pain, discharge and redness.  Respiratory: Negative for cough, hemoptysis, sputum production, shortness of breath,  wheezing and stridor.   Cardiovascular: Negative for chest pain, palpitations, orthopnea, claudication, leg swelling and PND.  Gastrointestinal: Positive for abdominal pain. Negative for heartburn, nausea, vomiting, diarrhea, constipation, blood in stool and melena.  Genitourinary: Negative for dysuria, urgency, frequency, hematuria and flank pain.  Musculoskeletal: Negative for myalgias, back pain, joint pain and falls.  Skin: Negative for itching and rash.  Neurological: Negative for dizziness, tingling, tremors, sensory change, speech change, focal weakness, seizures, loss of consciousness, weakness and headaches.  Endo/Heme/Allergies: Negative for environmental allergies and polydipsia. Does not bruise/bleed easily.  Psychiatric/Behavioral: Negative for depression, suicidal ideas, hallucinations, memory loss and substance abuse. The patient is not nervous/anxious and does not have insomnia.      PHYSICAL EXAM:  Blood pressure (!) 155/77, pulse 70, temperature 98.2 F (36.8 C), resp. rate 18, last menstrual period 07/20/2015, SpO2 100 %, unknown if currently breastfeeding.    Vitals reviewed. Constitutional: She is oriented to person, place, and time. She appears well-developed and well-nourished.  HENT:  Head: Normocephalic and atraumatic.  Right Ear: External ear normal.  Left Ear: External ear normal.  Nose: Nose normal.  Mouth/Throat: Oropharynx is clear and moist.  Eyes: Conjunctivae and EOM are normal. Pupils are equal, round,  and reactive to light. Right eye exhibits no discharge. Left eye exhibits no discharge. No scleral icterus.  Neck: Normal range of motion. Neck supple. No tracheal deviation present. No thyromegaly present.  Cardiovascular: Normal rate, regular rhythm, normal heart sounds and intact distal pulses.  Exam reveals no gallop and no friction rub.   No murmur heard. Respiratory: Effort normal and breath sounds normal. No respiratory distress. She has no  wheezes. She has no rales. She exhibits no tenderness.  GI: Soft. Bowel sounds are normal. She exhibits no distension and no mass. There is tenderness. There is no rebound and no guarding.  Genitourinary:       Vulva is normal without lesions Vagina is pink moist without discharge Cervix normal in appearance and pap is normal Uterus is gravid Adnexa is negative with normal sized ovaries by sonogram  Musculoskeletal: Normal range of motion. She exhibits no edema and no tenderness.  Neurological: She is alert and oriented to person, place, and time. She has normal reflexes. She displays normal reflexes. No cranial nerve deficit. She exhibits normal muscle tone. Coordination normal.  Skin: Skin is warm and dry. No rash noted. No erythema. No pallor.  Psychiatric: She has a normal mood and affect. Her behavior is normal. Judgment and thought content normal.    Labs: Results for orders placed or performed during the hospital encounter of 04/16/16 (from the past 336 hour(s))  CBC   Collection Time: 04/16/16 10:03 AM  Result Value Ref Range   WBC 10.2 4.0 - 10.5 K/uL   RBC 4.08 3.87 - 5.11 MIL/uL   Hemoglobin 12.5 12.0 - 15.0 g/dL   HCT 16.1 (L) 09.6 - 04.5 %   MCV 87.3 78.0 - 100.0 fL   MCH 30.6 26.0 - 34.0 pg   MCHC 35.1 30.0 - 36.0 g/dL   RDW 40.9 81.1 - 91.4 %   Platelets 183 150 - 400 K/uL  RPR   Collection Time: 04/16/16 10:03 AM  Result Value Ref Range   RPR Ser Ql Non Reactive Non Reactive  Type and screen   Collection Time: 04/16/16 10:03 AM  Result Value Ref Range   ABO/RH(D) A POS    Antibody Screen NEG    Sample Expiration 04/19/2016   ABO/Rh   Collection Time: 04/16/16 10:03 AM  Result Value Ref Range   ABO/RH(D) A POS   Results for orders placed or performed in visit on 04/09/16 (from the past 336 hour(s))  POCT urinalysis dipstick   Collection Time: 04/09/16 10:49 AM  Result Value Ref Range   Color, UA yellow    Clarity, UA clear    Glucose, UA neg     Bilirubin, UA     Ketones, UA neg    Spec Grav, UA     Blood, UA neg    pH, UA     Protein, UA neg    Urobilinogen, UA     Nitrite, UA neg    Leukocytes, UA Negative Negative    EKG: No orders found for this or any previous visit.  Imaging Studies: No results found.    Assessment: [redacted]w[redacted]d Estimated Date of Delivery: 04/25/16  Previous Caesarean section x 3 Desires sterilization Patient Active Problem List   Diagnosis Date Noted  . History of trichomonal vaginitis 01/28/2016  . Depression 09/16/2015  . Supervision of normal pregnancy 09/16/2015  . H/O cesarean section 10/17/2013  . Abnormal Pap smear of cervix 09/17/2013  . 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 Marijuana use 09/10/2013  .  Smoker 09/10/2013    Plan: Repeat section with bilateral tubal ligation  Jasun Gasparini H 04/19/2016 6:48 AM

## 2016-04-19 NOTE — Anesthesia Procedure Notes (Signed)
Spinal  Patient location during procedure: OR Preanesthetic Checklist Completed: patient identified, site marked, surgical consent, pre-op evaluation, timeout performed, IV checked, risks and benefits discussed and monitors and equipment checked Spinal Block Patient position: sitting Prep: DuraPrep Patient monitoring: cardiac monitor, continuous pulse ox, blood pressure and heart rate Approach: midline Location: L3-4 Injection technique: catheter Needle Needle type: Tuohy and Sprotte  Needle gauge: 24 G Needle length: 12.7 cm Needle insertion depth: 9 cm Catheter type: closed end flexible Catheter size: 19 g Catheter at skin depth: 15 cm Additional Notes Spinal thru touhy...Marland Kitchen.Marland Kitchen.Marland Kitchen. Dosage in OR  Bupivicaine ml       1.4 PFMS04   mcg        100  (-) asp heme/CSF

## 2016-04-19 NOTE — Transfer of Care (Signed)
Immediate Anesthesia Transfer of Care Note  Patient: Cathy Larson  Procedure(s) Performed: Procedure(s): REPEAT CESAREAN SECTION (N/A)  Patient Location: PACU  Anesthesia Type:Spinal and Epidural  Level of Consciousness: awake, alert  and oriented  Airway & Oxygen Therapy: Patient Spontanous Breathing  Post-op Assessment: Report given to RN and Post -op Vital signs reviewed and stable  Post vital signs: Reviewed and stable  Last Vitals:  Vitals:   04/19/16 0633 04/19/16 0640  BP: (!) 155/77   Pulse: 70   Resp: 18   Temp:  36.8 C    Last Pain:  Vitals:   04/19/16 0633  TempSrc: Oral      Patients Stated Pain Goal: 3 (04/19/16 [id number])  Complications: No apparent anesthesia complications

## 2016-04-20 ENCOUNTER — Encounter (HOSPITAL_COMMUNITY): Payer: Self-pay | Admitting: Obstetrics & Gynecology

## 2016-04-20 DIAGNOSIS — Z3490 Encounter for supervision of normal pregnancy, unspecified, unspecified trimester: Secondary | ICD-10-CM

## 2016-04-20 LAB — CBC
HCT: 29.2 % — ABNORMAL LOW (ref 36.0–46.0)
Hemoglobin: 10.2 g/dL — ABNORMAL LOW (ref 12.0–15.0)
MCH: 30.5 pg (ref 26.0–34.0)
MCHC: 34.9 g/dL (ref 30.0–36.0)
MCV: 87.4 fL (ref 78.0–100.0)
PLATELETS: 173 10*3/uL (ref 150–400)
RBC: 3.34 MIL/uL — AB (ref 3.87–5.11)
RDW: 12.8 % (ref 11.5–15.5)
WBC: 15.7 10*3/uL — ABNORMAL HIGH (ref 4.0–10.5)

## 2016-04-20 LAB — BIRTH TISSUE RECOVERY COLLECTION (PLACENTA DONATION)

## 2016-04-20 MED ORDER — INFLUENZA VAC SPLIT QUAD 0.5 ML IM SUSY
0.5000 mL | PREFILLED_SYRINGE | INTRAMUSCULAR | Status: AC
  Administered 2016-04-20: 0.5 mL via INTRAMUSCULAR
  Filled 2016-04-20: qty 0.5

## 2016-04-20 NOTE — Progress Notes (Signed)
Patient nauseated and vomited a small amount, resting in bed. Stated she will let me know if it doesn't pass and needs anything else.

## 2016-04-20 NOTE — Progress Notes (Cosign Needed)
POSTPARTUM PROGRESS NOTE  POD#1  Subjective:  Cathy Larson is a 25 y.o. [phone] 5852w1d s/p RLTCS with BTL.  No acute events overnight.  Pt denies problems with ambulating, voiding or po intake.  She denies nausea or vomiting.  Pain is well controlled.  She has had flatus. She has not had bowel movement.  Lochia small.   Objective: Blood pressure (!) 115/50, pulse (!) 58, temperature 98.5 F (36.9 C), temperature source Oral, resp. rate 18, last menstrual period 07/20/2015, SpO2 96 %, unknown if currently breastfeeding.  Physical Exam:  General: alert, cooperative and no distress Chest: no respiratory distress Heart:regular rate, distal pulses intact Abdomen: soft, nontender,  Uterine Fundus: firm, appropriately tender Skin: incision with dressing in place w/o any saturation DVT Evaluation: No calf swelling or tenderness Extremities: no edema   Recent Labs  04/19/16 0903 04/20/16 0518  HGB 11.3* 10.2*  HCT 32.5* 29.2*    Assessment/Plan:  ASSESSMENT: Cathy Larson is a 25 y.o. [id number] 5152w1d s/p RLTCS and BTL. Bottle feeding.    LOS: 1 day   Frederik PearJulie P Degele, MD 04/20/2016, 7:44 AM

## 2016-04-20 NOTE — Progress Notes (Signed)
UR chart review completed.  

## 2016-04-21 NOTE — Progress Notes (Signed)
Subjective: Postpartum Day #2: Cesarean Delivery & BTL Patient reports tolerating PO and + flatus; bottlefeeding going well  Objective: Vital signs in last 24 hours: Temp:  [98 F (36.7 C)-98.3 F (36.8 C)] 98 F (36.7 C) (09/27 1819) Pulse Rate:  [54-61] 61 (09/27 1819) Resp:  [18] 18 (09/27 1819) BP: (124-131)/(46-52) 124/52 (09/27 1819)  Physical Exam:  General: alert, cooperative and no distress Lochia: appropriate Uterine Fundus: firm Incision: staples present; honeycomb intact and dry, sm staining DVT Evaluation: No evidence of DVT seen on physical exam.   Recent Labs  04/19/16 0903 04/20/16 0518  HGB 11.3* 10.2*  HCT 32.5* 29.2*    Assessment/Plan: Status post Cesarean section. Doing well postoperatively.  Continue current care. Anticipate d/c on 04/22/16 with staples removed prior to d/c  Cam HaiSHAW, KIMBERLY CNM 04/21/2016, 11:01 PM

## 2016-04-21 NOTE — Clinical Social Work Maternal (Signed)
CLINICAL SOCIAL WORK MATERNAL/CHILD NOTE  Patient Details  Name: Cathy Larson MRN: [id number] Date of Birth: May 14, 1991  Date:  04/21/2016  Clinical Social Worker Initiating Note:  Cathy Larson, [place] Date/ Time Initiated:  04/21/16/1100     Child's Name:      Legal Guardian:  Other (Comment) (Parents: Cathy Larson and Cathy Larson)   Need for Interpreter:  None   Date of Referral:  04/20/16     Reason for Referral:  Current Substance Use/Substance Use During Pregnancy  (Hx of Depression)   Referral Source:  [facility]   Address:  [address], [place], [place] [address]  Phone number:  [phone]   Household Members:  Spouse, Minor Children ([name] and FOB have one other child (4) together.  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   Education:      Museum/gallery curator Resources:  Medicaid   Other Resources:      Cultural/Religious Considerations Which May Impact Care: None stated.  Strengths:  Ability to meet basic needs , Home prepared for child    Risk Factors/Current Problems:  Mental Health Concerns  (Hx Depression)   Cognitive State:  Able to Concentrate , Alert , Insightful , Linear Thinking , Goal Oriented    Mood/Affect:  Interested , Euthymic , Calm    CSW Assessment: CSW met with [name] in her first floor room to offer support and complete assessment due to hx of Depression and marijuana use.  [name] had a positive screen at her initial PNV at 8.2 weeks.  [name] was pleasant, welcoming and easy to engage.  She appeared open to talking with CSW. [name] reports that she and baby are  doing well, however, she feels her recovery from surgery has been harder so far than in the past.  She thinks this may be due to having a BTL in addition to the c-section.  She states no major concerns, however. [name] reports feeling increased anxiety and irritability during this pregnancy.  She reports she feels like these symptoms are triggered by FOB's need to be "right" and "know everything."  She states they both have "a short fuse."  She reports more arguing during pregnancy than before, but denies any physical altercations.  She reports they have been together for 6 years and married for 3.  She states feeling that the baby's birth has brought them closer together and has made them realize how they have been treating each other.  She reports that things have been much better in the past few days.  CSW discussed the importance of good communication and need to monitor emotions especially in the post partum time period.  [name] was attentive to information regarding perinatal mood disorders explained by CSW.  [name] reports no increase in emotionality after the births of her other children.  She states she has a hx of Anx/Dep and used medication in the past, but cannot recall what she was taking.  She states that she had not experienced symptoms needing treatment since approximately her "sophomore year in high school."  ([name] is  now 24).  She does not feel her current symptoms warrant treatment.  She is somewhat interested in resuming counseling, but is unsure she can "commit" at this time.  CSW provided [name] with information regarding support group/Feelings After Birth at [facility], a postpartum checklist to monitor feelings, and counseling resources in her area.  [name] was greatly appreciative. CSW inquired about hx of THC use.  [name] admits to using 4-5 times during pregnancy because of nausea, poor appetite and vomiting and states her last use was on 11/28/15 when she felt sick at a cookout they were at.  CSW  informed [name] of hospital drug screen policy and mandated reporting to Child Protective Services for positive screens.  Baby's UDS is negative.  [name] aware and understanding of policy.  She states no concerns and denies any other drug use.  [name] denies any history of CPS involvement with her other children.  CSW Plan/Description:  Engineer, mining , No Further Intervention Required/No Barriers to Discharge, Information/Referral to [place], [place], [place] 04/21/2016, 3:12 PM

## 2016-04-22 MED ORDER — IBUPROFEN 600 MG PO TABS: 600.0000 mg | ORAL_TABLET | Freq: Four times a day (QID) | ORAL | 0 refills | Status: DC | PRN

## 2016-04-22 MED ORDER — OXYCODONE-ACETAMINOPHEN 5-325 MG PO TABS: 1.0000 | ORAL_TABLET | ORAL | 0 refills | Status: DC | PRN

## 2016-04-22 NOTE — Discharge Instructions (Signed)

## 2016-04-22 NOTE — Discharge Summary (Signed)
OB Discharge Summary     Patient Name: Cathy Larson DOB: 04-08-1991 MRN: [id number]  Date of admission: 04/19/2016 Delivering MD: Duane Lope H   Date of discharge: 04/22/2016  Admitting diagnosis: repeat c-section x 3 Intrauterine pregnancy: [redacted]w[redacted]d     Secondary diagnosis:  Active Problems:   Pregnancy  Additional problems: desires BTS     Discharge diagnosis: Term Pregnancy Delivered                                                                                                Post partum procedures:none  Augmentation: N/A  Complications: None  Hospital course:  Sceduled C/S   25 y.o. yo [id number] at [redacted]w[redacted]d was admitted to the hospital 04/19/2016 for scheduled cesarean section/BTS with the following indication:Elective Repeat.  Membrane Rupture Time/Date: 7:52 AM ,04/19/2016   Patient delivered a Viable infant.04/19/2016  Details of operation can be found in separate operative note.  Pateint had an uncomplicated postpartum course.  She is ambulating, tolerating a regular diet, passing flatus, and urinating well. Patient is discharged home in stable condition on  04/22/16          Physical exam Vitals:   04/21/16 0600 04/21/16 1819 04/22/16 0605 04/22/16 0642  BP: (!) 131/46 (!) 124/52 (!) 143/63 137/74  Pulse: (!) 54 61 63 (!) 59  Resp: 18 18 18    Temp: 98.3 F (36.8 C) 98 F (36.7 C) 98.3 F (36.8 C)   TempSrc: Oral Oral Oral   SpO2:       General: alert and cooperative Lochia: appropriate Uterine Fundus: firm Incision: staples intact; honeycomb with min drainage DVT Evaluation: No evidence of DVT seen on physical exam. Labs: Lab Results  Component Value Date   WBC 15.7 (H) 04/20/2016   HGB 10.2 (L) 04/20/2016   HCT 29.2 (L) 04/20/2016   MCV 87.4 04/20/2016   PLT 173 04/20/2016   CMP Latest Ref Rng & Units 04/19/2016  Glucose 65 - 99 mg/dL 92  BUN 6 - 20 mg/dL 7  Creatinine 4.40 - 1.02 mg/dL 7.25  Sodium 366 - 440 mmol/L 135  Potassium 3.5 - 5.1 mmol/L  3.8  Chloride 101 - 111 mmol/L 104  CO2 22 - 32 mmol/L 26  Calcium 8.9 - 10.3 mg/dL 3.4(V)  Total Protein 6.5 - 8.1 g/dL 4.2(V)  Total Bilirubin 0.3 - 1.2 mg/dL 9.5(G)  Alkaline Phos 38 - 126 U/L 155(H)  AST 15 - 41 U/L 17  ALT 14 - 54 U/L 11(L)    Discharge instruction: per After Visit Summary and "Baby and Me Booklet".  After visit meds:    Medication List    STOP taking these medications   acetaminophen 500 MG tablet Commonly known as:  TYLENOL     TAKE these medications   ibuprofen 600 MG tablet Commonly known as:  ADVIL,MOTRIN Take 1 tablet (600 mg total) by mouth every 6 (six) hours as needed.   multivitamin-prenatal 27-0.8 MG Tabs tablet Take 1 tablet by mouth daily at 12 noon.   oxyCODONE-acetaminophen 5-325 MG tablet Commonly known as:  PERCOCET/ROXICET Take 1 tablet  by mouth every 4 (four) hours as needed (pain scale 4-7).       Diet: routine diet  Activity: Advance as tolerated. Pelvic rest for 6 weeks.   Outpatient follow up:2 weeks Follow up Appt:Future Appointments Date Time Provider Department Center  04/30/2016 10:30 AM Lazaro ArmsLuther H Eure, MD [facility]   Follow up Visit:No Follow-up on file.  Postpartum contraception: Tubal Ligation  Newborn Data: Live born female  Birth Weight: 7 lb 12.3 oz (3525 g) APGAR: 7, 9  Baby Feeding: Bottle Disposition:home with mother; staples to be removed prior to d/c   04/22/2016 Cam HaiSHAW, Halim Surrette, CNM  9:21 AM

## 2016-04-22 NOTE — Op Note (Signed)
Preoperative diagnosis:  1.  Intrauterine pregnancy at 394w1d  weeks gestation                                         2.  Previous Caesarean section x 3                                         3.  Desires sterilization   Postoperative diagnosis:  Same as above   Procedure:  Repeat cesarean section +Modified Pomeroy bilateral tubal ligation  Surgeon:  Lazaro ArmsLuther H Akima Slaugh MD  Assistant:    Anesthesia: Spinal/Epidural combined  Findings:  .    Over a low transverse incision was delivered a viable female with Apgars of 7 and 9 weighing 7 lbs. 12 oz. Uterus, tubes and ovaries were all normal.  There were no other significant findings  Description of operation:  Patient was taken to the operating room and placed in the sitting position where she underwent a spinal anesthetic. She was then placed in the supine position with tilt to the left side. When adequate anesthetic level was obtained she was prepped and draped in usual sterile fashion and a Foley catheter was placed. A Pfannenstiel skin incision was made and carried down sharply to the rectus fascia which was scored in the midline extended laterally. The fascia was taken off the muscles both superiorly and without difficulty. The muscles were divided.  The peritoneal cavity was entered.  Bladder blade was placed, no bladder flap was created.  A low transverse hysterotomy incision was made and delivered a viable female  infant at [id number] with Apgars of 7 and 9 weighing7 lbs 12 oz.  Cord pH was obtained and was pending. The uterus was exteriorized. It was closed in 2 layers, the first being a running interlocking layer and the second being an imbricating layer using 0 monocryl on a CTX needle. There was good resulting hemostasis. The uterus tubes and ovaries were all normal. A modified Pomeroy bilateral tubal ligation was performed in the usual fashion without difficulty.  Peritoneal cavity was irrigated vigorously. The muscles and peritoneum were reapproximated  loosely. The fascia was closed using 0 Vicryl in running fashion. Subcutaneous tissue was made hemostatic and irrigated. The skin was closed using skin staples.blood loss for   the procedure was 700 cc. The patient received  Ancef prophylactically. The patient was taken to the recovery room in good stable condition with all counts being correct x3.  EBL 700 cc  Jolly Bleicher H

## 2016-04-30 ENCOUNTER — Ambulatory Visit (INDEPENDENT_AMBULATORY_CARE_PROVIDER_SITE_OTHER): Payer: Medicaid Other | Admitting: Obstetrics & Gynecology

## 2016-04-30 ENCOUNTER — Encounter: Payer: Self-pay | Admitting: Obstetrics & Gynecology

## 2016-04-30 VITALS — BP 108/58 | HR 54 | Ht 62.5 in | Wt 194.0 lb

## 2016-04-30 DIAGNOSIS — B372 Candidiasis of skin and nail: Secondary | ICD-10-CM

## 2016-04-30 DIAGNOSIS — Z98891 History of uterine scar from previous surgery: Secondary | ICD-10-CM

## 2016-04-30 MED ORDER — IBUPROFEN 600 MG PO TABS: 600.0000 mg | ORAL_TABLET | Freq: Four times a day (QID) | ORAL | 0 refills | Status: DC | PRN

## 2016-04-30 NOTE — Addendum Note (Signed)
Addended by: Lazaro ArmsEURE, LUTHER H on: 04/30/2016 11:56 AM   Modules accepted: Orders

## 2016-04-30 NOTE — Progress Notes (Addendum)
  HPI: Patient returns for routine postoperative follow-up having undergone repeat Caesarean section with BTL on 04/19/2016.  The patient's immediate postoperative recovery has been unremarkable. Since hospital discharge the patient reports some odor from incision, stri strips were placed after staples removed.   Current Outpatient Prescriptions: ibuprofen (ADVIL,MOTRIN) 600 MG tablet, Take 1 tablet (600 mg total) by mouth every 6 (six) hours as needed., Disp: 30 tablet, Rfl: 0 oxyCODONE-acetaminophen (PERCOCET/ROXICET) 5-325 MG tablet, Take 1 tablet by mouth every 4 (four) hours as needed (pain scale 4-7)., Disp: 40 tablet, Rfl: 0 Prenatal Vit-Fe Fumarate-FA (MULTIVITAMIN-PRENATAL) 27-0.8 MG TABS tablet, Take 1 tablet by mouth daily at 12 noon., Disp: 30 each, Rfl: 11  No current facility-administered medications for this visit.     Blood pressure (!) 108/58, pulse (!) 54, height 5' 2.5" (1.588 m), weight 194 lb (88 kg), unknown if currently breastfeeding.  Physical Exam: Incision clean some yeast/chronic moisture changes gentian violet paint placed  Diagnostic Tests:   Pathology:   Impression: S/p repeat Caesarean section+ BTL  Plan: Routine post op care  Follow up: 4  weeks  Lazaro ArmsEURE,LUTHER H, MD  Meds ordered this encounter  Medications  . ibuprofen (ADVIL,MOTRIN) 600 MG tablet    Sig: Take 1 tablet (600 mg total) by mouth every 6 (six) hours as needed.    Dispense:  30 tablet    Refill:  0

## 2016-05-28 ENCOUNTER — Ambulatory Visit: Payer: Medicaid Other | Admitting: Women's Health

## 2016-06-10 ENCOUNTER — Encounter: Payer: Self-pay | Admitting: Advanced Practice Midwife

## 2016-06-10 ENCOUNTER — Ambulatory Visit (INDEPENDENT_AMBULATORY_CARE_PROVIDER_SITE_OTHER): Payer: Medicaid Other | Admitting: Advanced Practice Midwife

## 2016-06-10 MED ORDER — ESCITALOPRAM OXALATE 10 MG PO TABS: 10.0000 mg | ORAL_TABLET | Freq: Every day | ORAL | 6 refills | Status: AC

## 2016-06-10 NOTE — Progress Notes (Signed)
Cathy Larson is a 25 y.o. who presents for a postpartum visit. She is 6 weeks postpartum following a low cervical transverse Cesarean section. I have fully reviewed the prenatal and intrapartum course. The delivery was at 39.1 gestational weeks.  Anesthesia: spinal. Postpartum course has been uneventful. Baby's course has been uneventful. Baby is feeding by bottle. Bleeding: staining only. Bowel function is normal. Bladder function is normal. Patient is sexually active. Contraception method is tubal ligation. Postpartum depression screening: 12.  She feels overly emotional, low energy, some depression. Hx depression/anxiety as a teen. Requests meds/counsling. Husband doesn't believe in depression.   Current Outpatient Prescriptions:  .  ibuprofen (ADVIL,MOTRIN) 600 MG tablet, Take 1 tablet (600 mg total) by mouth every 6 (six) hours as needed., Disp: 30 tablet, Rfl: 0 .  Prenatal Vit-Fe Fumarate-FA (MULTIVITAMIN-PRENATAL) 27-0.8 MG TABS tablet, Take 1 tablet by mouth daily at 12 noon., Disp: 30 each, Rfl: 11 .  oxyCODONE-acetaminophen (PERCOCET/ROXICET) 5-325 MG tablet, Take 1 tablet by mouth every 4 (four) hours as needed (pain scale 4-7). (Patient not taking: Reported on 06/10/2016), Disp: 40 tablet, Rfl: 0  Review of Systems   Constitutional: Negative for fever and chills Eyes: Negative for visual disturbances Respiratory: Negative for shortness of breath, dyspnea Cardiovascular: Negative for chest pain or palpitations  Gastrointestinal: Negative for vomiting, diarrhea and constipation Genitourinary: Negative for dysuria and urgency Musculoskeletal: Negative for back pain, joint pain, myalgias  Neurological: Negative for dizziness and headaches    Objective:     Vitals:   06/10/16 0855  BP: 120/64  Pulse: 76   General:  alert, cooperative and no distress   Breasts:  negative  Lungs: clear to auscultation bilaterally  Heart:  regular rate and rhythm  Abdomen: Soft, nontender.   Incision well healed   Vulva:  normal  Vagina: normal vagina  Cervix:  closed  Corpus: Well involuted     Rectal Exam: no hemorrhoids        Assessment:    normal postpartum exam. Depression/postpartum Plan:   1. Contraception: tubal ligation 2. Follow up in: 4 weeks for med check.  [facility]

## 2016-07-08 ENCOUNTER — Ambulatory Visit: Payer: Medicaid Other | Admitting: Women's Health

## 2016-07-29 ENCOUNTER — Ambulatory Visit: Payer: Medicaid Other | Admitting: Women's Health

## 2017-12-29 ENCOUNTER — Encounter: Payer: Self-pay | Admitting: Women's Health

## 2018-01-27 ENCOUNTER — Other Ambulatory Visit: Payer: Self-pay | Admitting: Women's Health

## 2018-01-30 ENCOUNTER — Encounter: Payer: Self-pay | Admitting: Women's Health

## 2018-01-30 ENCOUNTER — Other Ambulatory Visit (HOSPITAL_COMMUNITY)
Admission: RE | Admit: 2018-01-30 | Discharge: 2018-01-30 | Disposition: A | Discharge status: Home / Self Care | Payer: Medicaid Other | Source: Ambulatory Visit | Attending: Obstetrics & Gynecology | Admitting: Obstetrics & Gynecology

## 2018-01-30 ENCOUNTER — Ambulatory Visit (INDEPENDENT_AMBULATORY_CARE_PROVIDER_SITE_OTHER): Payer: Medicaid Other | Admitting: Women's Health

## 2018-01-30 ENCOUNTER — Other Ambulatory Visit: Payer: Self-pay

## 2018-01-30 VITALS — BP 108/56 | HR 59 | Ht 63.0 in | Wt 169.0 lb

## 2018-01-30 DIAGNOSIS — Z309 Encounter for contraceptive management, unspecified: Secondary | ICD-10-CM

## 2018-01-30 DIAGNOSIS — Z3009 Encounter for other general counseling and advice on contraception: Secondary | ICD-10-CM | POA: Diagnosis not present

## 2018-01-30 DIAGNOSIS — R768 Other specified abnormal immunological findings in serum: Secondary | ICD-10-CM

## 2018-01-30 DIAGNOSIS — Z9851 Tubal ligation status: Secondary | ICD-10-CM | POA: Insufficient documentation

## 2018-01-30 DIAGNOSIS — Z113 Encounter for screening for infections with a predominantly sexual mode of transmission: Secondary | ICD-10-CM

## 2018-01-30 DIAGNOSIS — Z01419 Encounter for gynecological examination (general) (routine) without abnormal findings: Secondary | ICD-10-CM

## 2018-01-30 MED ORDER — PREDNISONE 20 MG PO TABS: 40.0000 mg | ORAL_TABLET | Freq: Every day | ORAL | 0 refills | Status: AC

## 2018-01-30 MED ORDER — NAPROXEN 500 MG PO TABS: 500.0000 mg | ORAL_TABLET | Freq: Two times a day (BID) | ORAL | 0 refills | Status: DC

## 2018-01-30 NOTE — Progress Notes (Signed)
WELL-WOMAN EXAMINATION Patient name: Cathy Larson MRN [id number]  Date of birth: Dec 13, 1990 Chief Complaint:   Gynecologic Exam (c/o stomach pains; random bruises; itching hands)  History of Present Illness:   Cathy Larson is a 27 y.o. [phone] female being seen today for a routine FP [facility] well-woman exam. Reports bruising all over legs and abdomen that started the day [place] [facility] [place] continue to pop up and do not go away, do not itch/hurt. No one else who got in [place] has this. Is not on any meds/birth control. Is s/p BTL. Does have small bumps on hands/feet that itch, then go away and leave flaky area, no one else has these. [facility] ED 12/30/17 b/c she was concerned, Hgb 11.8, Plt 309, ESR 23, PT 10.9/INR 1.0, UDS+ cocaine/amphetamines/THC. States she only used cocaine x 1 and that was 4d prior to going to ED, has no idea where amphetamines came from. Also reports Lt breast pain x 2wks, much worse for past week.   PCP: Quintin Alto      does not desire labs Patient's last menstrual period was 01/06/2018. The current method of family planning is tubal ligation Last pap 09/16/15. Results were: normal, had LSIL pap 2015 Last mammogram: never. Results were: n/a Last colonoscopy: never. Results were: n/a  Review of Systems:   Pertinent items are noted in HPI Denies any headaches, blurred vision, fatigue, shortness of breath, chest pain, abdominal pain, abnormal vaginal discharge/itching/odor/irritation, problems with periods, bowel movements, urination, or intercourse unless otherwise stated above. Pertinent History Reviewed:  Reviewed past medical,surgical, social and family history.  Reviewed problem list, medications and allergies. Physical Assessment:   Vitals:   01/30/18 1142  BP: (!) 108/56  Pulse: (!) 59  Weight: 169 lb (76.7 kg)  Height: 5' 3"  (1.6 m)  Body mass index is 29.94 kg/m.        Physical Examination:   General appearance - well  appearing, and in no distress  Mental status - alert, oriented to person, place, and time  Psych:  She has a normal mood and affect  Skin - warm and dry, normal color, no suspicious lesions noted  Chest - effort normal, all lung fields clear to auscultation bilaterally  Heart - normal rate and regular rhythm  Neck:  midline trachea, no thyromegaly or nodules  Breasts - breasts appear normal, no suspicious masses, no skin or nipple changes or  axillary nodes. Lateral Lt breast very tender to touch, pt jumps and cries. Co-exam w/ LHE, pushed breast tissue to side and says is chest wall/costochondritis, pt states 1.26yo has been more active lately/having to pick him up a lot  Abdomen - soft, nontender, nondistended, no masses or organomegaly  Pelvic - VULVA: normal appearing vulva with no masses, tenderness or lesions  VAGINA: normal appearing vagina with normal color and discharge, no lesions  CERVIX: normal appearing cervix without discharge or lesions, no CMT  Thin prep pap is done w/ reflex HR HPV cotesting  UTERUS: uterus is felt to be normal size, shape, consistency and nontender   ADNEXA: No adnexal masses or tenderness noted.  Extremities:  No swelling or varicosities noted. Multiple areas of darkened pigmentation on bilateral legs, do not blanch, are not bruises, also has one small spot on abdomen, co-exam w/ LHE who says hyperpigmentation of unknown cause  No results found for this or any previous visit (from the past 24 hour(s)).  Assessment & Plan:  1) Well-Woman Exam  2) Hyperpigmented areas legs/abdomen> unknown origin  3) Costochondritis> per LHE rx prednisone 47m daily x 10d, rx naprosyn for pain  4) STD screen, gc/ct from pap, HIV, RPR  Labs/procedures today: pap  Mammogram @26yo  or sooner if problems Colonoscopy @26yo  or sooner if problems  Orders Placed This Encounter  Procedures  . HIV antibody  . RPR    Follow-up: Return in about 2 weeks (around 02/13/2018) for  F/U.  [facility]7/02/2018 2:05 PM

## 2018-01-31 LAB — RPR: RPR Ser Ql: NONREACTIVE

## 2018-01-31 LAB — HIV ANTIBODY (ROUTINE TESTING W REFLEX): HIV SCREEN 4TH GENERATION: NONREACTIVE

## 2018-02-02 LAB — CYTOLOGY - PAP
Chlamydia: NEGATIVE
Diagnosis: NEGATIVE
Neisseria Gonorrhea: NEGATIVE

## 2018-02-13 ENCOUNTER — Ambulatory Visit: Payer: Medicaid Other | Admitting: Women's Health

## 2018-02-20 ENCOUNTER — Ambulatory Visit: Payer: Medicaid Other | Admitting: Women's Health

## 2018-02-20 ENCOUNTER — Encounter: Payer: Self-pay | Admitting: Women's Health

## 2021-10-25 ENCOUNTER — Encounter: Payer: Self-pay | Admitting: Emergency Medicine

## 2021-10-25 ENCOUNTER — Emergency Department (HOSPITAL_COMMUNITY): Payer: Medicaid Other

## 2021-10-25 ENCOUNTER — Encounter (HOSPITAL_COMMUNITY): Payer: Self-pay | Admitting: Emergency Medicine

## 2021-10-25 ENCOUNTER — Other Ambulatory Visit: Payer: Self-pay

## 2021-10-25 ENCOUNTER — Emergency Department (HOSPITAL_COMMUNITY)
Admission: EM | Admit: 2021-10-25 | Discharge: 2021-10-25 | Disposition: A | Discharge status: Home / Self Care | Payer: Medicaid Other | Attending: Emergency Medicine | Admitting: Emergency Medicine

## 2021-10-25 ENCOUNTER — Other Ambulatory Visit (HOSPITAL_COMMUNITY): Payer: Medicaid Other

## 2021-10-25 ENCOUNTER — Ambulatory Visit
Admission: EM | Admit: 2021-10-25 | Discharge: 2021-10-25 | Disposition: A | Discharge status: Home / Self Care | Payer: Medicaid Other | Attending: Urgent Care | Admitting: Urgent Care

## 2021-10-25 DIAGNOSIS — S6991XA Unspecified injury of right wrist, hand and finger(s), initial encounter: Secondary | ICD-10-CM | POA: Insufficient documentation

## 2021-10-25 DIAGNOSIS — R0789 Other chest pain: Secondary | ICD-10-CM | POA: Insufficient documentation

## 2021-10-25 DIAGNOSIS — R58 Hemorrhage, not elsewhere classified: Secondary | ICD-10-CM

## 2021-10-25 DIAGNOSIS — M79641 Pain in right hand: Secondary | ICD-10-CM

## 2021-10-25 DIAGNOSIS — M25531 Pain in right wrist: Secondary | ICD-10-CM

## 2021-10-25 DIAGNOSIS — S60221A Contusion of right hand, initial encounter: Secondary | ICD-10-CM

## 2021-10-25 MED ORDER — KETOROLAC TROMETHAMINE 60 MG/2ML IM SOLN
60.0000 mg | Freq: Once | INTRAMUSCULAR | Status: AC
  Administered 2021-10-25: 60 mg via INTRAMUSCULAR
  Filled 2021-10-25: qty 2

## 2021-10-25 MED ORDER — NAPROXEN 500 MG PO TABS
500.0000 mg | ORAL_TABLET | Freq: Two times a day (BID) | ORAL | 0 refills | Status: AC
Start: 2021-10-25 — End: ?

## 2021-10-25 NOTE — ED Notes (Signed)
ED Provider at bedside. 
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
## 2021-10-25 NOTE — Discharge Instructions (Signed)
X-rays are negative for any fractures or dislocations.  Joint take Tylenol Motrin if the pain gets bad.  You can ice the hand and elbow at home.  Follow-up with your primary symptoms persist. ?

## 2022-09-11 ENCOUNTER — Emergency Department (HOSPITAL_COMMUNITY)
Admission: EM | Admit: 2022-09-11 | Discharge: 2022-09-12 | Disposition: A | Discharge status: Home / Self Care | Payer: Medicaid Other | Attending: Emergency Medicine | Admitting: Emergency Medicine

## 2022-09-11 ENCOUNTER — Emergency Department (HOSPITAL_COMMUNITY): Payer: Medicaid Other

## 2022-09-11 ENCOUNTER — Other Ambulatory Visit: Payer: Self-pay

## 2022-09-11 DIAGNOSIS — M545 Low back pain, unspecified: Secondary | ICD-10-CM | POA: Insufficient documentation

## 2022-09-11 DIAGNOSIS — N939 Abnormal uterine and vaginal bleeding, unspecified: Secondary | ICD-10-CM | POA: Diagnosis not present

## 2022-09-11 LAB — COMPREHENSIVE METABOLIC PANEL
ALT: 20 U/L (ref 0–44)
AST: 23 U/L (ref 15–41)
Albumin: 3.5 g/dL (ref 3.5–5.0)
Alkaline Phosphatase: 90 U/L (ref 38–126)
Anion gap: 10 (ref 5–15)
BUN: 8 mg/dL (ref 6–20)
CO2: 26 mmol/L (ref 22–32)
Calcium: 9.6 mg/dL (ref 8.9–10.3)
Chloride: 103 mmol/L (ref 98–111)
Creatinine, Ser: 0.72 mg/dL (ref 0.44–1.00)
GFR, Estimated: >60 mL/min (ref >60)
Glucose, Bld: 105 mg/dL — ABNORMAL HIGH (ref 70–99)
Potassium: 3.3 mmol/L — ABNORMAL LOW (ref 3.5–5.1)
Sodium: 139 mmol/L (ref 135–145)
Total Bilirubin: <0.1 mg/dL — ABNORMAL LOW (ref 0.3–1.2)
Total Protein: 6.7 g/dL (ref 6.5–8.1)

## 2022-09-11 LAB — CBC WITH DIFFERENTIAL/PLATELET
Abs Immature Granulocytes: 0.01 10*3/uL (ref 0.00–0.07)
Basophils Absolute: 0 10*3/uL (ref 0.0–0.1)
Basophils Relative: 1 %
Eosinophils Absolute: 0.3 10*3/uL (ref 0.0–0.5)
Eosinophils Relative: 5 %
HCT: 37.2 % (ref 36.0–46.0)
Hemoglobin: 12.4 g/dL (ref 12.0–15.0)
Immature Granulocytes: 0 %
Lymphocytes Relative: 31 %
Lymphs Abs: 2 10*3/uL (ref 0.7–4.0)
MCH: 29.2 pg (ref 26.0–34.0)
MCHC: 33.3 g/dL (ref 30.0–36.0)
MCV: 87.7 fL (ref 80.0–100.0)
Monocytes Absolute: 0.5 10*3/uL (ref 0.1–1.0)
Monocytes Relative: 8 %
Neutro Abs: 3.6 10*3/uL (ref 1.7–7.7)
Neutrophils Relative %: 55 %
Platelets: 282 10*3/uL (ref 150–400)
RBC: 4.24 MIL/uL (ref 3.87–5.11)
RDW: 13.5 % (ref 11.5–15.5)
WBC: 6.5 10*3/uL (ref 4.0–10.5)
nRBC: 0 % (ref 0.0–0.2)

## 2022-09-11 LAB — URINALYSIS, ROUTINE W REFLEX MICROSCOPIC
Bilirubin Urine: NEGATIVE
Glucose, UA: NEGATIVE mg/dL
Ketones, ur: NEGATIVE mg/dL
Leukocytes,Ua: NEGATIVE
Nitrite: NEGATIVE
Protein, ur: NEGATIVE mg/dL
Specific Gravity, Urine: >1.03 — ABNORMAL HIGH (ref 1.005–1.030)
pH: 5.5 (ref 5.0–8.0)

## 2022-09-11 LAB — URINALYSIS, MICROSCOPIC (REFLEX)

## 2022-09-11 LAB — I-STAT BETA HCG BLOOD, ED (MC, WL, AP ONLY): I-stat hCG, quantitative: <5 m[IU]/mL (ref <5)

## 2022-09-11 NOTE — ED Triage Notes (Signed)
Patient reports vaginal bleeding with clots this week with low back discomfort/pressure .

## 2022-09-11 NOTE — ED Provider Notes (Signed)
[facility] Emergency Department Provider Note MRN:  [id number]  Arrival date & time: 09/12/22     Chief Complaint   Vaginal Bleeding /Low back discomfort   History of Present Illness   Cathy Larson is a 32 y.o. year-old female presents to the ED with chief complaint of vaginal bleeding.  States that she has had heavier and irregular bleeding and clots.  Reports some suprapubic pressure.  States that she is not bleeding right now.  Denies any treatments PTA.  Reports some lower back pain.  History provided by patient.   Review of Systems  Pertinent positive and negative review of systems noted in HPI.    Physical Exam   Vitals:   09/11/22 2007  BP: 133/73  Pulse: 77  Resp: 16  Temp: 98.3 F (36.8 C)  SpO2: 96%    CONSTITUTIONAL:  well-appearing, NAD NEURO:  Alert and oriented x 3, CN 3-12 grossly intact EYES:  eyes equal and reactive ENT/NECK:  Supple, no stridor  CARDIO:  normal rate, regular rhythm, appears well-perfused  PULM:  No respiratory distress, CTAB GI/GU:  non-distended, no focal abdominal tenderness MSK/SPINE:  No gross deformities, no edema, moves all extremities  SKIN:  no rash, atraumatic   *Additional and/or pertinent findings included in MDM below  Diagnostic and Interventional Summary    EKG Interpretation  Date/Time:    Ventricular Rate:    PR Interval:    QRS Duration:   QT Interval:    QTC Calculation:   R Axis:     Text Interpretation:         Labs Reviewed  COMPREHENSIVE METABOLIC PANEL - Abnormal; Notable for the following components:      Result Value   Potassium 3.3 (*)    Glucose, Bld 105 (*)    Total Bilirubin <0.1 (*)    All other components within normal limits  URINALYSIS, ROUTINE W REFLEX MICROSCOPIC - Abnormal; Notable for the following components:   APPearance HAZY (*)    Specific Gravity, Urine >1.030 (*)    Hgb urine dipstick LARGE (*)    All other components within normal limits   URINALYSIS, MICROSCOPIC (REFLEX) - Abnormal; Notable for the following components:   Bacteria, UA RARE (*)    All other components within normal limits  CBC WITH DIFFERENTIAL/PLATELET  I-STAT BETA HCG BLOOD, ED ([facility], [facility], [facility] ONLY)    [place] Art/Ven Flow Abd Pelv Doppler  Final Result    US Transvaginal Non-OB  Final Result    US Pelvis Complete  Final Result      Medications - No data to display   Procedures  /  Critical Care Procedures  ED Course and Medical Decision Making  I have reviewed the triage vital signs, the nursing notes, and pertinent available records from the EMR.  Social Determinants Affecting Complexity of Care: Patient has no clinically significant social determinants affecting this chief complaint..   ED Course:    Medical Decision Making Patient here with vaginal bleeding.  Heavier that normal.  Not bleeding currently.  [place] negative for mass.  VSS.  HGB stable.  Appears appropriate for outpatient follow-up.  Amount and/or Complexity of Data Reviewed Labs: ordered.    Details: HCG negative, doubt ectopic HGB 12.4, not anemic No leukocytosis K 3.3, give PO k-dur Radiology: ordered and independent interpretation performed.  Risk OTC drugs.     Consultants: Emergency department workup does not suggest an emergent condition requiring admission or immediate intervention beyond  what has been performed at this time. The patient is safe for discharge and has  been instructed to return immediately for worsening symptoms, change in  symptoms or any other concerns   Treatment and Plan: Emergency department workup does not suggest an emergent condition requiring admission or immediate intervention beyond  what has been performed at this time. The patient is safe for discharge and has  been instructed to return immediately for worsening symptoms, change in  symptoms or any other concerns    Final Clinical Impressions(s) / ED Diagnoses     ICD-10-CM   1.  Abnormal uterine bleeding  N93.9       ED Discharge Orders     None         Discharge Instructions Discussed with and Provided to Patient:   Discharge Instructions   None      Montine Circle, PA-C 09/12/22 0011    Cristie Hem, MD 09/12/22 [phone]

## 2022-09-12 MED ORDER — POTASSIUM CHLORIDE CRYS ER 20 MEQ PO TBCR
40.0000 meq | EXTENDED_RELEASE_TABLET | Freq: Once | ORAL | Status: AC
  Administered 2022-09-12: 40 meq via ORAL
  Filled 2022-09-12: qty 2

## 2022-10-12 ENCOUNTER — Ambulatory Visit: Payer: Medicaid Other | Admitting: Women's Health

## 2023-09-05 IMAGING — DX DG WRIST COMPLETE 3+V*R*
3 series · 3 of 3 positions shown · non-contrast
Comparison: None.

CLINICAL DATA: Acute RIGHT wrist pain following injury. Initial
encounter.

EXAM:
RIGHT WRIST - COMPLETE 3+ VIEW

[wrist ap]
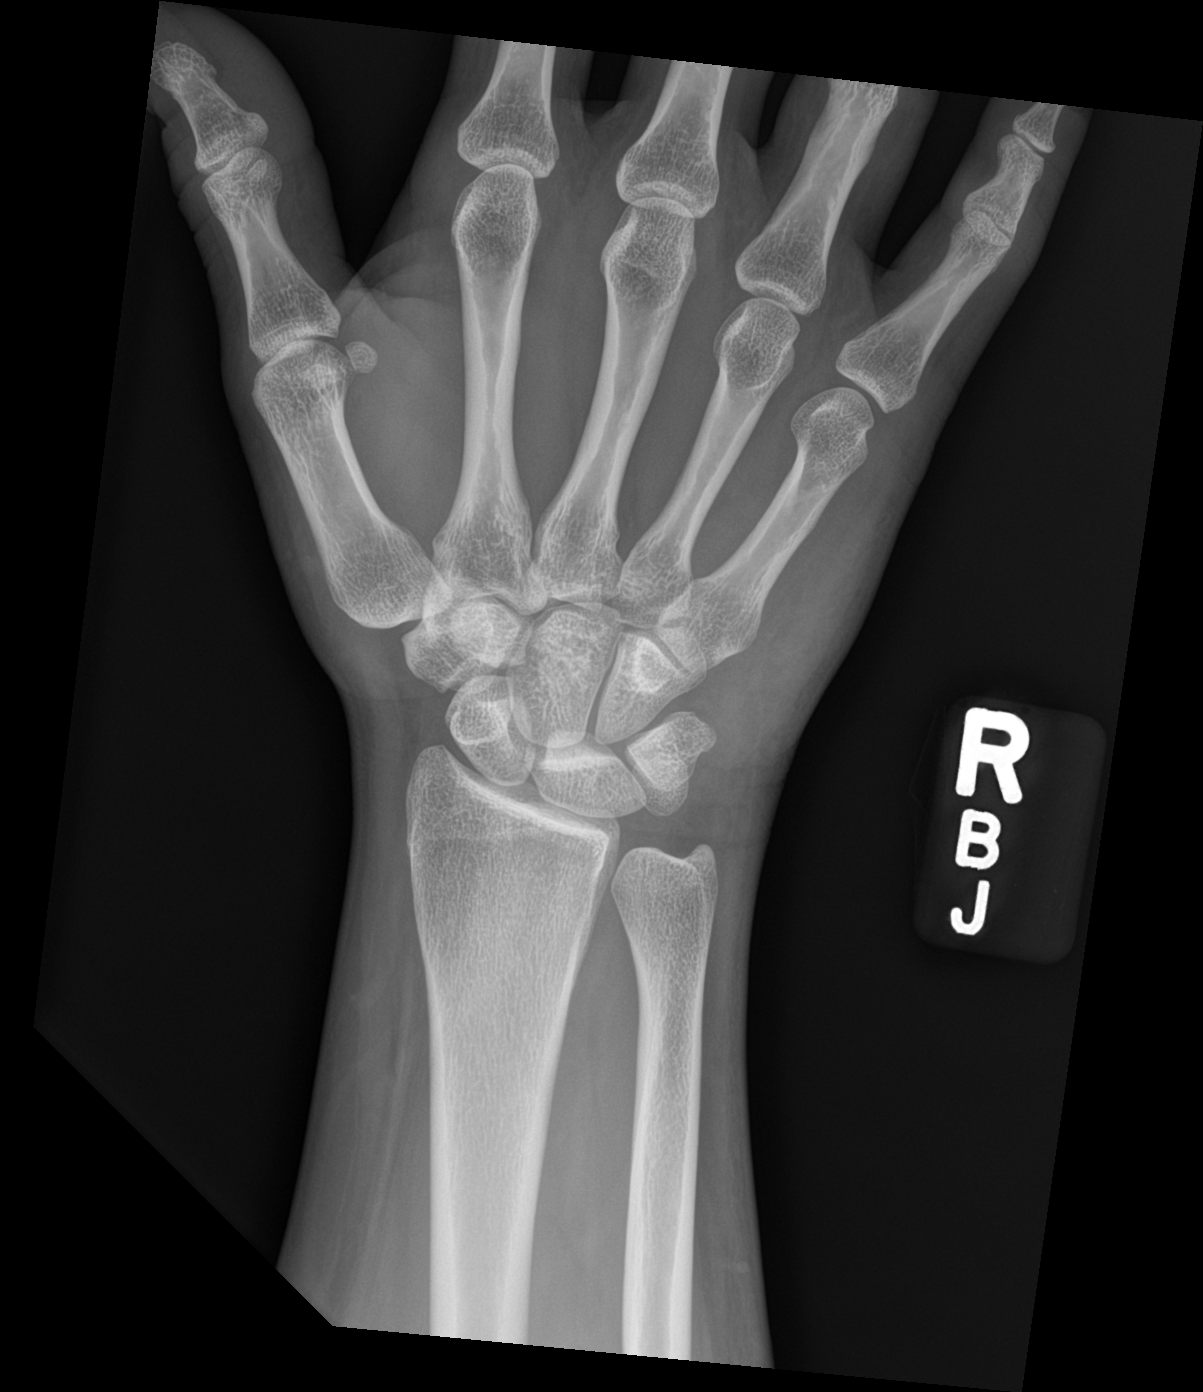

[wrist obl]
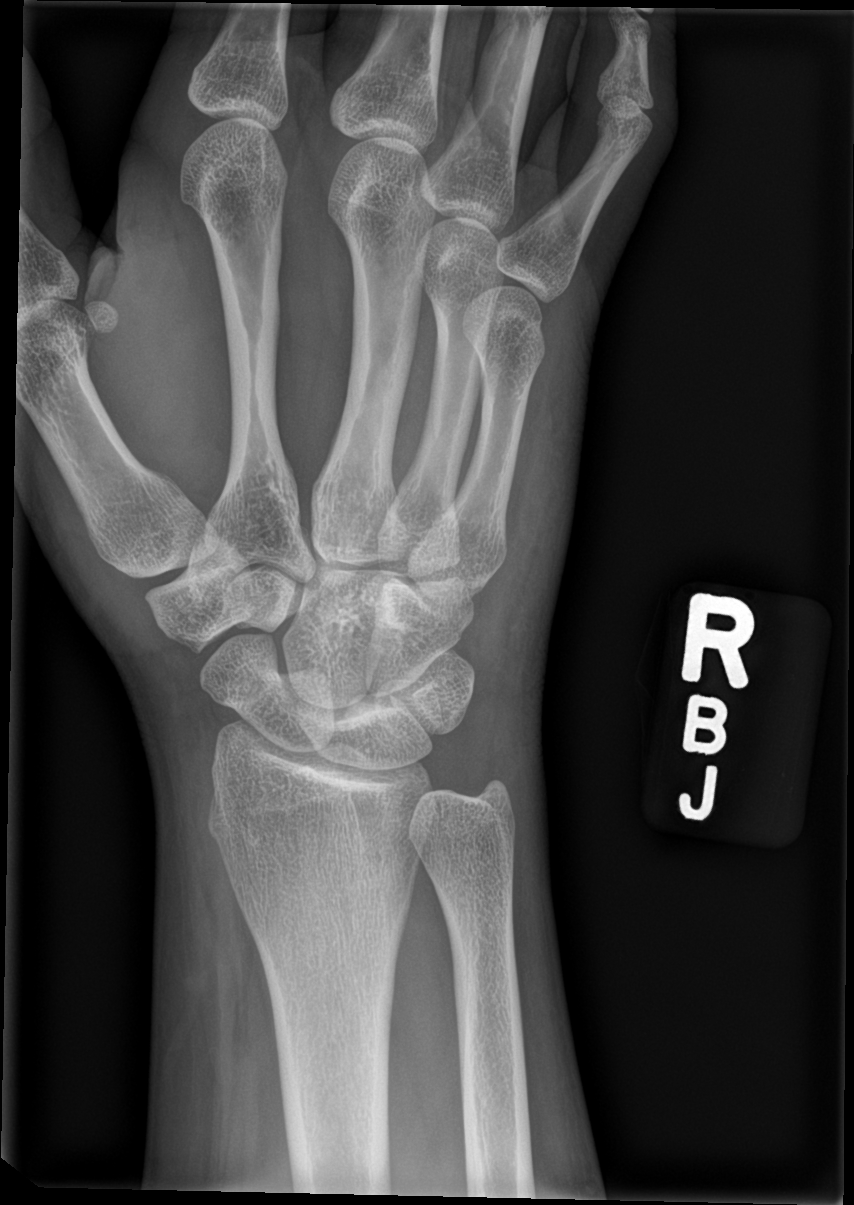

[wrist lat]
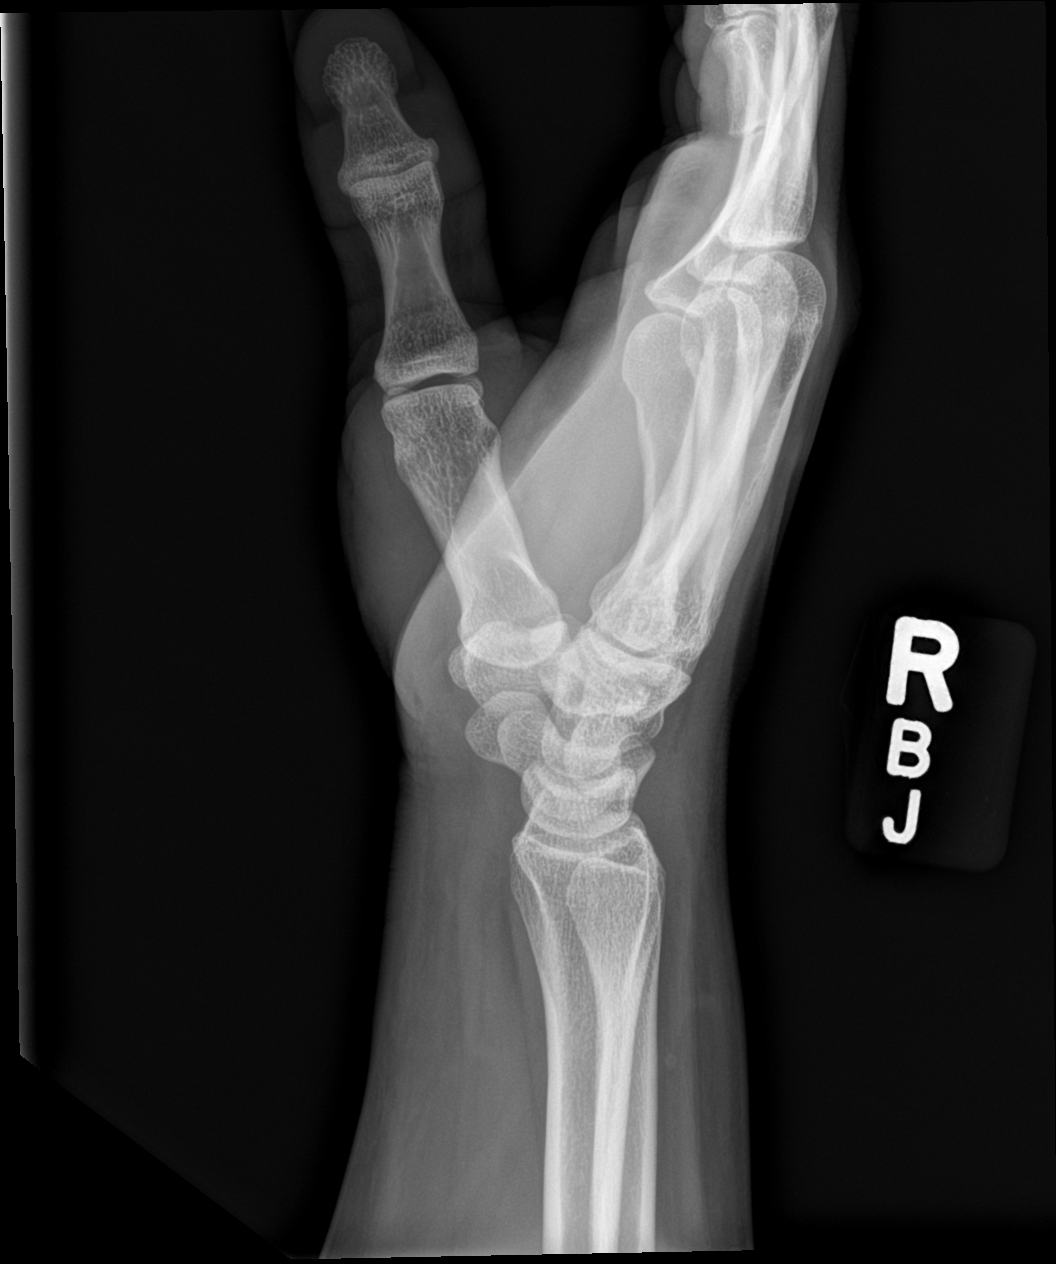

[3 of 3 positions shown; findings below may reference images not displayed]

FINDINGS: There is no evidence of fracture or dislocation. There is no
evidence of arthropathy or other focal bone abnormality. Soft
tissues are unremarkable.
IMPRESSION: Negative.

## 2023-12-24 ENCOUNTER — Emergency Department (HOSPITAL_COMMUNITY)
Admission: EM | Admit: 2023-12-24 | Discharge: 2023-12-27 | Discharge status: Against Medical Advice | Attending: Emergency Medicine | Admitting: Emergency Medicine

## 2023-12-24 ENCOUNTER — Emergency Department (HOSPITAL_COMMUNITY)

## 2023-12-24 ENCOUNTER — Other Ambulatory Visit: Payer: Self-pay

## 2023-12-24 DIAGNOSIS — M25522 Pain in left elbow: Secondary | ICD-10-CM | POA: Diagnosis not present

## 2023-12-24 DIAGNOSIS — S80812A Abrasion, left lower leg, initial encounter: Secondary | ICD-10-CM | POA: Diagnosis not present

## 2023-12-24 DIAGNOSIS — Y92410 Unspecified street and highway as the place of occurrence of the external cause: Secondary | ICD-10-CM | POA: Insufficient documentation

## 2023-12-24 DIAGNOSIS — M79662 Pain in left lower leg: Secondary | ICD-10-CM | POA: Diagnosis present

## 2023-12-24 MED ORDER — MORPHINE SULFATE (PF) 4 MG/ML IV SOLN
4.0000 mg | Freq: Once | INTRAVENOUS | Status: DC
  Filled 2023-12-24: qty 1

## 2023-12-24 MED ORDER — ONDANSETRON 4 MG PO TBDP
8.0000 mg | ORAL_TABLET | Freq: Once | ORAL | Status: DC
  Filled 2023-12-24: qty 2

## 2023-12-24 NOTE — ED Notes (Signed)
 Pt still not found in the room. Not found in the lobby either.

## 2023-12-24 NOTE — ED Notes (Signed)
 X-ray at bedside
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
## 2023-12-24 NOTE — ED Notes (Signed)
 Went in to the room to medicate patient. She's not in there, nor are her belongings. C collar was sitting on the bedside table. Per security, pt in a gown seen leaving with family.
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
